# Patient Record
Sex: Male | Born: 1965 | Race: White | Hispanic: No | Marital: Single | Smoking: Current every day smoker
Health system: Southern US, Community
[De-identification: ages and names within clinical notes are randomized; demographics above are authoritative.]

## PROBLEM LIST (undated history)

## (undated) DIAGNOSIS — F419 Anxiety disorder, unspecified: Secondary | ICD-10-CM

## (undated) DIAGNOSIS — J189 Pneumonia, unspecified organism: Secondary | ICD-10-CM

## (undated) DIAGNOSIS — F32A Depression, unspecified: Secondary | ICD-10-CM

## (undated) DIAGNOSIS — F329 Major depressive disorder, single episode, unspecified: Secondary | ICD-10-CM

## (undated) DIAGNOSIS — K219 Gastro-esophageal reflux disease without esophagitis: Secondary | ICD-10-CM

## (undated) HISTORY — DX: Depression, unspecified: F32.A

## (undated) HISTORY — DX: Pneumonia, unspecified organism: J18.9

## (undated) HISTORY — PX: OTHER SURGICAL HISTORY: SHX169

## (undated) HISTORY — DX: Anxiety disorder, unspecified: F41.9

## (undated) HISTORY — DX: Gastro-esophageal reflux disease without esophagitis: K21.9

---
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
## 2019-05-23 ENCOUNTER — Encounter: Payer: Self-pay | Admitting: Gastroenterology

## 2019-05-25 ENCOUNTER — Ambulatory Visit (INDEPENDENT_AMBULATORY_CARE_PROVIDER_SITE_OTHER): Payer: Medicaid Other | Admitting: Gastroenterology

## 2019-05-25 ENCOUNTER — Other Ambulatory Visit: Payer: Self-pay

## 2019-05-25 ENCOUNTER — Encounter: Payer: Self-pay | Admitting: Gastroenterology

## 2019-05-25 VITALS — BP 116/74 | HR 63 | Temp 98.2°F | Ht 73.0 in | Wt 132.5 lb

## 2019-05-25 DIAGNOSIS — R1013 Epigastric pain: Secondary | ICD-10-CM | POA: Diagnosis not present

## 2019-05-25 DIAGNOSIS — K219 Gastro-esophageal reflux disease without esophagitis: Secondary | ICD-10-CM | POA: Diagnosis not present

## 2019-05-25 DIAGNOSIS — Z8371 Family history of colonic polyps: Secondary | ICD-10-CM | POA: Diagnosis not present

## 2019-05-25 DIAGNOSIS — K581 Irritable bowel syndrome with constipation: Secondary | ICD-10-CM | POA: Diagnosis not present

## 2019-05-25 DIAGNOSIS — Z1159 Encounter for screening for other viral diseases: Secondary | ICD-10-CM

## 2019-05-25 MED ORDER — PANTOPRAZOLE SODIUM 40 MG PO TBEC: 40.0000 mg | DELAYED_RELEASE_TABLET | Freq: Every day | ORAL | 11 refills | Status: DC

## 2019-05-25 NOTE — Progress Notes (Signed)
Chief Complaint:   Referring Provider:  Antonietta Jewel, MD      ASSESSMENT AND PLAN;   #1. GERD  #2.  Epigastric pain  #3. IBS with predom constipation  #4. FH of colon polyps and several family members mainly second-degree relatives had colon cancer.   Plan: -protonix 40mg  po qd. -Proceed with EGD/colon. Discussed risks & benefits. (Risks including rare perforation req laparotomy, bleeding after biopsies/polypectomy req blood transfusion, rare chance of missing neoplasms, risks of anesthesia/sedation). Benefits outweigh the risks. Patient agrees to proceed. All the questions were answered. Consent forms given for review. -Stop smoking. -Get bld tests from Dr Roberto Odom. - If still with problems, [place] abdo and further work-up. HPI:    Roberto Odom is a 53 y.o. male  Sent here by Dr. Sheryle Odom With epigastric pain x 1 year, intermittent, worse after eating, associated with heartburn but no nausea/vomiting.  This is despite omeprazole.  No nonsteroidals.  No odynophagia or dysphagia.  No significant weight loss.  In fact he has gained weight.  Also has alternating constipation and occasionally diarrhea with abdominal bloating, lower abdominal discomfort which gets better with defecation.  Has been diagnosed as having IBS in the past.  Never had colonoscopy performed.  Has strong family history of colonic polyps in mother and several second-degree relatives with colon cancer.  No fever chills or night sweats.  Denies use of opioids/drugs/excessive sodas.  Continues to smoke despite medical advice.  Accompanied by his sister, who is a patient of ours.  Had blood work performed by Dr. Sheryle Odom -Per patient's sister, these were normal.  I do not have a copy of the present time   Past Medical History:  Diagnosis Date  . Anxiety   . Depression   . GERD (gastroesophageal reflux disease)   . Pneumonia     Past Surgical History:  Procedure Laterality Date  . thumb surgery Left     age 4 possibly     Family History  Problem Relation Age of Onset  . Colon polyps Mother   . Breast cancer Mother   . Cervical cancer Mother   . Irritable bowel syndrome Mother   . Ovarian cancer Sister   . Colon cancer Maternal Uncle   . Prostate cancer Maternal Uncle   . Colon cancer Maternal Uncle   . Colon cancer Maternal Uncle   . Colon cancer Maternal Uncle   . Irritable bowel syndrome Sister   . Esophageal cancer Neg Hx     Social History   Tobacco Use  . Smoking status: Current Every Day Smoker  . Smokeless tobacco: Never Used  Substance Use Topics  . Alcohol use: Not Currently  . Drug use: Not Currently    Current Outpatient Medications  Medication Sig Dispense Refill  . Acetaminophen (TYLENOL PO) Take 1 tablet by mouth as needed.    . cetirizine (ZYRTEC) 10 MG tablet Take 20 mg by mouth daily.    . fluticasone (FLONASE) 50 MCG/ACT nasal spray 2 sprays 2 (two) times daily.    Marland Kitchen omeprazole (PRILOSEC) 20 MG capsule Take 20 mg by mouth 2 (two) times daily.     No current facility-administered medications for this visit.     Allergies  Allergen Reactions  . Ibuprofen     Rapid heartbeat     Review of Systems:  Constitutional: Denies fever, chills, diaphoresis, appetite change and fatigue.  HEENT: Denies photophobia, eye pain, redness, hearing loss, ear pain, congestion, sore throat, rhinorrhea, sneezing, mouth  sores, neck pain, neck stiffness and tinnitus.   Respiratory: Denies SOB, DOE, cough, chest tightness,  and wheezing.   Cardiovascular: Denies chest pain, palpitations and leg swelling.  Genitourinary: Denies dysuria, urgency, frequency, hematuria, flank pain and difficulty urinating.  Musculoskeletal: Denies myalgias, back pain, joint swelling, arthralgias and gait problem.  Skin: No rash.  Neurological: Denies dizziness, seizures, syncope, weakness, light-headedness, numbness and headaches.  Hematological: Denies adenopathy. Easy bruising, personal  or family bleeding history  Psychiatric/Behavioral: has anxiety or depression     Physical Exam:    BP 116/74   Pulse 63   Temp 98.2 F (36.8 C)   Ht 6\' 1"  (1.854 m)   Wt 132 lb 8 oz (60.1 kg)   BMI 17.48 kg/m  Filed Weights   05/25/19 1110  Weight: 132 lb 8 oz (60.1 kg)   Constitutional:  Well-developed, in no acute distress. Psychiatric: Normal mood and affect. Behavior is normal. HEENT: Pupils normal.  Conjunctivae are normal. No scleral icterus. Neck supple.  Cardiovascular: Normal rate, regular rhythm. No edema Pulmonary/chest: Effort normal and breath sounds normal. No wheezing, rales or rhonchi. Abdominal: Soft, nondistended.  Mild epigastric tenderness. Bowel sounds active throughout. There are no masses palpable. No hepatomegaly. Rectal:  defered Neurological: Alert and oriented to person place and time. Skin: Skin is warm and dry. No rashes noted.   Roberto Austria, MD 05/25/2019, 11:22 AM  Cc: Roberto Jewel, MD

## 2019-05-25 NOTE — Patient Instructions (Signed)
If you are age 55 or older, your body mass index should be between 23-30. Your Body mass index is 17.48 kg/m. If this is out of the aforementioned range listed, please consider follow up with your Primary Care Provider.  If you are age 51 or younger, your body mass index should be between 19-25. Your Body mass index is 17.48 kg/m. If this is out of the aformentioned range listed, please consider follow up with your Primary Care Provider.   We have sent the following medications to your pharmacy for you to pick up at your convenience: Protonix   You have been scheduled for an endoscopy and colonoscopy. Please follow the written instructions given to you at your visit today. Please pick up your prep supplies at the pharmacy within the next 1-3 days. If you use inhalers (even only as needed), please bring them with you on the day of your procedure. Your physician has requested that you go to www.startemmi.com and enter the access code given to you at your visit today. This web site gives a general overview about your procedure. However, you should still follow specific instructions given to you by our office regarding your preparation for the procedure.  Due to recent COVID-19 restrictions implemented by [employer] and state authorities and in an effort to keep both patients and staff as safe as possible, [facility] requires COVID-19 testing prior to any scheduled endoscopic procedure. The testing center is located at [address] [place], [place] [address].  Testing will be performed Monday through Friday 8 am- 4 pm.  You will require your COVID screen 2 days prior to your endoscopic procedure.  You are not required to quarantine after your screening.  You will only receive a phone call with the results if it is POSITIVE.  If you do not receive a call the day before your procedure you should begin your prep, if ordered, and you should report to the endo center  for your procedure at your designated appointment arrival time ( one hour prior to the procedure time). There is no cost to you for the screening on the day of the swab.  [facility] will file with your insurance company for the testing.    You may receive an automated phone call prior to your procedure or have a message in your MyChart that you have an appointment for a BP/15 at the [facility], please disregard this message.  Your testing will be at the [address] , [address] location.   Thank you,  Dr. Jackquline Denmark

## 2019-05-26 ENCOUNTER — Telehealth: Payer: Self-pay | Admitting: Gastroenterology

## 2019-05-26 NOTE — Telephone Encounter (Signed)
I have resent patients directions and rescheduled the patient for their COVID test. I have mailed all instructions to patient.

## 2019-05-26 NOTE — Telephone Encounter (Signed)
Pt's sister rescheduled EGD/col to 06/20/19 at 2 PM and would like to know when to have COVID-19 test.  She also requested new instructions letter.

## 2019-06-03 ENCOUNTER — Inpatient Hospital Stay (HOSPITAL_COMMUNITY)
Admission: RE | Admit: 2019-06-03 | Discharge: 2019-06-03 | Disposition: A | Discharge status: Home / Self Care | Payer: Medicaid Other | Source: Ambulatory Visit

## 2019-06-03 NOTE — Progress Notes (Signed)
LVM for pt to reschedule COVID test, as he is too early. Pt needs to be tested on 11/2 for a 11/5 procedure.

## 2019-06-07 ENCOUNTER — Encounter: Payer: Medicaid Other | Admitting: Gastroenterology

## 2019-06-13 ENCOUNTER — Other Ambulatory Visit (HOSPITAL_COMMUNITY)
Admission: RE | Admit: 2019-06-13 | Discharge: 2019-06-13 | Disposition: A | Discharge status: Home / Self Care | Payer: Medicaid Other | Source: Ambulatory Visit | Attending: Gastroenterology | Admitting: Gastroenterology

## 2019-06-13 NOTE — Progress Notes (Addendum)
Called patient to reschedule his covid test because it is to early for him to be tested prior to the procedure on 11/9, phone will ring, someone answers then hangs up. No way to leave a voicemail.  Attempted to call 3 times  3:21 PM Left message for patient who will need to go to [facility] for his Covid testing. Left address on voice mail

## 2019-06-16 ENCOUNTER — Other Ambulatory Visit: Payer: Self-pay | Admitting: Gastroenterology

## 2019-06-17 LAB — SARS CORONAVIRUS 2 (TAT 6-24 HRS): SARS Coronavirus 2: NEGATIVE

## 2019-06-20 ENCOUNTER — Other Ambulatory Visit: Payer: Self-pay

## 2019-06-20 ENCOUNTER — Encounter: Payer: Self-pay | Admitting: Gastroenterology

## 2019-06-20 ENCOUNTER — Other Ambulatory Visit: Payer: Self-pay | Admitting: Gastroenterology

## 2019-06-20 ENCOUNTER — Ambulatory Visit (AMBULATORY_SURGERY_CENTER): Payer: Medicaid Other | Admitting: Gastroenterology

## 2019-06-20 VITALS — BP 121/74 | HR 65 | Temp 97.8°F | Resp 12 | Ht 73.0 in | Wt 132.0 lb

## 2019-06-20 DIAGNOSIS — K219 Gastro-esophageal reflux disease without esophagitis: Secondary | ICD-10-CM | POA: Diagnosis not present

## 2019-06-20 DIAGNOSIS — K2950 Unspecified chronic gastritis without bleeding: Secondary | ICD-10-CM | POA: Diagnosis not present

## 2019-06-20 DIAGNOSIS — K3189 Other diseases of stomach and duodenum: Secondary | ICD-10-CM

## 2019-06-20 DIAGNOSIS — Z1211 Encounter for screening for malignant neoplasm of colon: Secondary | ICD-10-CM | POA: Diagnosis not present

## 2019-06-20 DIAGNOSIS — Z8371 Family history of colonic polyps: Secondary | ICD-10-CM

## 2019-06-20 DIAGNOSIS — D128 Benign neoplasm of rectum: Secondary | ICD-10-CM

## 2019-06-20 DIAGNOSIS — D123 Benign neoplasm of transverse colon: Secondary | ICD-10-CM | POA: Diagnosis not present

## 2019-06-20 DIAGNOSIS — D125 Benign neoplasm of sigmoid colon: Secondary | ICD-10-CM

## 2019-06-20 MED ORDER — SODIUM CHLORIDE 0.9 % IV SOLN: 500.0000 mL | Freq: Once | INTRAVENOUS | Status: DC

## 2019-06-20 NOTE — Op Note (Signed)
[facility] Patient Name: Roberto Odom Procedure Date: 06/20/2019 2:00 PM MRN: [id number] Endoscopist: Jackquline Denmark , MD Age: 53 Referring MD:  Date of Birth: [birth year] Gender: Male Account #: [id number] Procedure:                Colonoscopy Indications:              Colon cancer screening in patient at increased                            risk: Family history of 1st-degree relative with                            colon polyps before age 67 years Medicines:                Monitored Anesthesia Care Procedure:                Pre-Anesthesia Assessment:                           - Prior to the procedure, a History and Physical                            was performed, and patient medications and                            allergies were reviewed. The patient's tolerance of                            previous anesthesia was also reviewed. The risks                            and benefits of the procedure and the sedation                            options and risks were discussed with the patient.                            All questions were answered, and informed consent                            was obtained. Prior Anticoagulants: The patient has                            taken no previous anticoagulant or antiplatelet                            agents. ASA Grade Assessment: II - A patient with                            mild systemic disease. After reviewing the risks                            and benefits, the patient was deemed in  satisfactory condition to undergo the procedure.                           After obtaining informed consent, the colonoscope                            was passed under direct vision. Throughout the                            procedure, the patient's blood pressure, pulse, and                            oxygen saturations were monitored continuously. The                            Colonoscope was introduced  through the anus and                            advanced to the the cecum, identified by                            appendiceal orifice and ileocecal valve. The                            colonoscopy was performed without difficulty. The                            patient tolerated the procedure well. The quality                            of the bowel preparation was adequate to identify                            polyps. The ileocecal valve, appendiceal orifice,                            and rectum were photographed. Scope In: 2:14:16 PM Scope Out: 2:29:35 PM Scope Withdrawal Time: 0 hours 11 minutes 34 seconds  Total Procedure Duration: 0 hours 15 minutes 19 seconds  Findings:                 A 6 mm polyp was found in the proximal transverse                            colon. The polyp was sessile. The polyp was removed                            with a cold snare. Resection and retrieval were                            complete.                           A 15 mm polyp was found in the recto-sigmoid colon,  18 cm from anal verge. The polyp was                            semi-pedunculated. The polyp was removed with a hot                            snare. Resection and retrieval were complete.                            Estimated blood loss: none.                           A 12 mm polyp was found in the rectum, 12 cm from                            anal verge. The polyp was sessile. The polyp was                            removed with a hot snare in one piece. Resection                            and retrieval were complete. Estimated blood loss:                            none.                           A few small-mouthed diverticula were found in the                            sigmoid colon.                           The exam was otherwise without abnormality on                            direct and retroflexion views. Complications:            No  immediate complications. Estimated Blood Loss:     Estimated blood loss: none. Impression:               -Significant colonic polyps s/p polypectomy.                           -Mild sigmoid diverticulosis.                           -Otherwise normal colonoscopy. Recommendation:           - Patient has a contact number available for                            emergencies. The signs and symptoms of potential                            delayed complications were discussed with the  patient. Return to normal activities tomorrow.                            Written discharge instructions were provided to the                            patient.                           - Resume previous diet.                           - Continue present medications.                           - Await pathology results.                           - No aspirin, ibuprofen, naproxen, or other                            non-steroidal anti-inflammatory drugs for 5 days                            after polyp removal.                           - Repeat colonoscopy in 6 months for surveillance                            based on pathology results to remove any                            residual/recurrent/small polyps after 2-day prep..                           - Return to GI clinic PRN. Jackquline Denmark, MD 06/20/2019 2:43:28 PM This report has been signed electronically.

## 2019-06-20 NOTE — Progress Notes (Signed)
Called to room to assist during endoscopic procedure.  Patient ID and intended procedure confirmed with present staff. Received instructions for my participation in the procedure from the performing physician.  

## 2019-06-20 NOTE — Progress Notes (Signed)
To PACU, VSS. Report to [email or web address] 

## 2019-06-20 NOTE — Op Note (Signed)
[facility] Patient Name: Roberto Odom Procedure Date: 06/20/2019 2:00 PM MRN: [id number] Endoscopist: Jackquline Denmark , MD Age: 53 Referring MD:  Date of Birth: 05-20-1966 Gender: Male Account #: [id number] Procedure:                Upper GI endoscopy Indications:              GERD Medicines:                Monitored Anesthesia Care Procedure:                Pre-Anesthesia Assessment:                           - Prior to the procedure, a History and Physical                            was performed, and patient medications and                            allergies were reviewed. The patient's tolerance of                            previous anesthesia was also reviewed. The risks                            and benefits of the procedure and the sedation                            options and risks were discussed with the patient.                            All questions were answered, and informed consent                            was obtained. Prior Anticoagulants: The patient has                            taken no previous anticoagulant or antiplatelet                            agents. ASA Grade Assessment: II - A patient with                            mild systemic disease. After reviewing the risks                            and benefits, the patient was deemed in                            satisfactory condition to undergo the procedure.                           After obtaining informed consent, the endoscope was  passed under direct vision. Throughout the                            procedure, the patient's blood pressure, pulse, and                            oxygen saturations were monitored continuously. The                            Endoscope was introduced through the mouth, and                            advanced to the second part of duodenum. The upper                            GI endoscopy was accomplished without difficulty.                             The patient tolerated the procedure well. Scope In: Scope Out: Findings:                 The examined esophagus was normal.                           The Z-line was regular and was found 40 cm from the                            incisors. Examined by NBI.                           Localized mild inflammation characterized by                            erythema was found in the gastric antrum. Biopsies                            were taken with a cold forceps for histology.                           The examined duodenum was normal. Complications:            No immediate complications. Estimated Blood Loss:     Estimated blood loss: none. Impression:               -Mild gastritis.                           -Otherwise normal EGD. Recommendation:           - Patient has a contact number available for                            emergencies. The signs and symptoms of potential                            delayed complications were discussed with the  patient. Return to normal activities tomorrow.                            Written discharge instructions were provided to the                            patient.                           - Resume previous diet.                           - Continue present medications.                           - Await pathology results. Jackquline Denmark, MD 06/20/2019 2:36:19 PM This report has been signed electronically.

## 2019-06-20 NOTE — Progress Notes (Signed)
Pt's states no medical or surgical changes since previsit or office visit.  Temp-jb  V/s-[place]

## 2019-06-20 NOTE — Patient Instructions (Signed)
HANDOUTS GIVEN : POLYPS, GASTRITIS  NO ASPIRIN, ASPIRIN CONTAINING PRODUCTS (BC OR GOODY POWDERS) OR NSAIDS (IBUPROFEN, ADVIL, ALEVE, AND MOTRIN) FOR 5 DAYS, 06/25/2019; TYLENOL IS OK TO TAKE  EXPECT A REMINDER LETTER TO SCHEDULE REPEAT COLONOSCOPY IN 6 MONTHS TO CHECK FOR RESIDUAL POLYPS  YOU HAD AN ENDOSCOPIC PROCEDURE TODAY AT [facility]:   Refer to the procedure report that was given to you for any specific questions about what was found during the examination.  If the procedure report does not answer your questions, please call your gastroenterologist to clarify.  If you requested that your care partner not be given the details of your procedure findings, then the procedure report has been included in a sealed envelope for you to review at your convenience later.  YOU SHOULD EXPECT: Some feelings of bloating in the abdomen. Passage of more gas than usual.  Walking can help get rid of the air that was put into your GI tract during the procedure and reduce the bloating. If you had a lower endoscopy (such as a colonoscopy or flexible sigmoidoscopy) you may notice spotting of blood in your stool or on the toilet paper. If you underwent a bowel prep for your procedure, you may not have a normal bowel movement for a few days.  Please Note:  You might notice some irritation and congestion in your nose or some drainage.  This is from the oxygen used during your procedure.  There is no need for concern and it should clear up in a day or so.  SYMPTOMS TO REPORT IMMEDIATELY:   Following lower endoscopy (colonoscopy or flexible sigmoidoscopy):  Excessive amounts of blood in the stool  Significant tenderness or worsening of abdominal pains  Swelling of the abdomen that is new, acute  Fever of 100F or higher   Following upper endoscopy (EGD)  Vomiting of blood or coffee ground material  New chest pain or pain under the shoulder blades  Painful or persistently difficult  swallowing  New shortness of breath  Fever of 100F or higher  Black, tarry-looking stools  For urgent or emergent issues, a gastroenterologist can be reached at any hour by calling [phone].   DIET:  We do recommend a small meal at first, but then you may proceed to your regular diet.  Drink plenty of fluids but you should avoid alcoholic beverages for 24 hours.  ACTIVITY:  You should plan to take it easy for the rest of today and you should NOT DRIVE or use heavy machinery until tomorrow (because of the sedation medicines used during the test).    FOLLOW UP: Our staff will call the number listed on your records 48-72 hours following your procedure to check on you and address any questions or concerns that you may have regarding the information given to you following your procedure. If we do not reach you, we will leave a message.  We will attempt to reach you two times.  During this call, we will ask if you have developed any symptoms of COVID 19. If you develop any symptoms (ie: fever, flu-like symptoms, shortness of breath, cough etc.) before then, please call [phone].  If you test positive for Covid 19 in the 2 weeks post procedure, please call and report this information to [place].    If any biopsies were taken you will be contacted by phone or by letter within the next 1-3 weeks.  Please call us at [phone] if you have not heard  about the biopsies in 3 weeks.    SIGNATURES/CONFIDENTIALITY: You and/or your care partner have signed paperwork which will be entered into your electronic medical record.  These signatures attest to the fact that that the information above on your After Visit Summary has been reviewed and is understood.  Full responsibility of the confidentiality of this discharge information lies with you and/or your care-partner.

## 2019-06-22 ENCOUNTER — Telehealth: Payer: Self-pay

## 2019-06-22 NOTE — Telephone Encounter (Signed)
  Follow up Call-  Call back number 06/20/2019  Post procedure Call Back phone  # [phone]  Permission to leave phone message Yes     Patient questions:  Do you have a fever, pain , or abdominal swelling? No. Pain Score  0 *  Have you tolerated food without any problems? Yes.    Have you been able to return to your normal activities? Yes.    Do you have any questions about your discharge instructions: Diet   No. Medications  No. Follow up visit  No.  Do you have questions or concerns about your Care? No.  Actions: * If pain score is 4 or above: No action needed, pain <4.  1. Have you developed a fever since your procedure? no  2.   Have you had an respiratory symptoms (SOB or cough) since your procedure? no  3.   Have you tested positive for COVID 19 since your procedure?  no  4.   Have you had any family members/close contacts diagnosed with the COVID 19 since your procedure?  no   If yes to any of these questions please route to Joylene John, RN and Alphonsa Gin, Therapist, sports.

## 2019-06-26 ENCOUNTER — Encounter: Payer: Self-pay | Admitting: Gastroenterology

## 2019-12-12 ENCOUNTER — Telehealth: Payer: Self-pay | Admitting: Gastroenterology

## 2019-12-12 NOTE — Telephone Encounter (Signed)
Pt's sister Terrance Mass called stating that Prilosec no longer works for pt. He has been experiencing pain and has not been able to eat. She would like some advise.

## 2019-12-12 NOTE — Telephone Encounter (Signed)
Roberto Odom's contact # is [phone].

## 2019-12-13 NOTE — Telephone Encounter (Signed)
Please advise 

## 2019-12-14 ENCOUNTER — Telehealth: Payer: Self-pay | Admitting: Gastroenterology

## 2019-12-14 ENCOUNTER — Other Ambulatory Visit: Payer: Self-pay | Admitting: Gastroenterology

## 2019-12-14 MED ORDER — PANTOPRAZOLE SODIUM 40 MG PO TBEC: 40.0000 mg | DELAYED_RELEASE_TABLET | Freq: Every day | ORAL | 6 refills | Status: DC

## 2019-12-14 NOTE — Telephone Encounter (Signed)
Protonix 40 mg daily #30  6RF sent to pharmacy

## 2019-12-14 NOTE — Telephone Encounter (Signed)
LMOM for patient to call back.

## 2019-12-14 NOTE — Telephone Encounter (Signed)
Please call in Protonix 40 mg p.o. once a day, 30, 6 refills RG

## 2019-12-19 NOTE — Telephone Encounter (Signed)
I have called patient and left message for a return call.

## 2019-12-20 NOTE — Telephone Encounter (Signed)
Have left message for patient to return my call.

## 2019-12-21 MED ORDER — FAMOTIDINE 40 MG PO TABS: 40.0000 mg | ORAL_TABLET | Freq: Every day | ORAL | 3 refills | Status: DC

## 2019-12-21 NOTE — Telephone Encounter (Signed)
Patient is complaining of bad acid reflux and is currently taking Protonix twice daily. Patient would like to know if there is anything else he can take?

## 2019-12-21 NOTE — Telephone Encounter (Signed)
Sent prescription in to patients pharmacy. Patient notified.

## 2019-12-21 NOTE — Telephone Encounter (Signed)
Lets add Pepcid 40 mg p.o. nightly Let us know how he is in 2 weeks Stop all nonsteroidals  RG

## 2020-09-07 ENCOUNTER — Other Ambulatory Visit: Payer: Self-pay | Admitting: Gastroenterology

## 2020-09-26 ENCOUNTER — Other Ambulatory Visit (INDEPENDENT_AMBULATORY_CARE_PROVIDER_SITE_OTHER): Payer: Medicaid Other

## 2020-09-26 ENCOUNTER — Ambulatory Visit (HOSPITAL_BASED_OUTPATIENT_CLINIC_OR_DEPARTMENT_OTHER)
Admission: RE | Admit: 2020-09-26 | Discharge: 2020-09-26 | Disposition: A | Discharge status: Home / Self Care | Payer: Medicaid Other | Source: Ambulatory Visit | Attending: Gastroenterology | Admitting: Gastroenterology

## 2020-09-26 ENCOUNTER — Encounter: Payer: Self-pay | Admitting: Gastroenterology

## 2020-09-26 ENCOUNTER — Other Ambulatory Visit: Payer: Self-pay

## 2020-09-26 ENCOUNTER — Ambulatory Visit (INDEPENDENT_AMBULATORY_CARE_PROVIDER_SITE_OTHER): Payer: Medicaid Other | Admitting: Gastroenterology

## 2020-09-26 VITALS — BP 132/92 | HR 78 | Ht 70.0 in | Wt 119.5 lb

## 2020-09-26 DIAGNOSIS — Z86010 Personal history of colonic polyps: Secondary | ICD-10-CM

## 2020-09-26 DIAGNOSIS — R112 Nausea with vomiting, unspecified: Secondary | ICD-10-CM

## 2020-09-26 DIAGNOSIS — K219 Gastro-esophageal reflux disease without esophagitis: Secondary | ICD-10-CM | POA: Insufficient documentation

## 2020-09-26 DIAGNOSIS — R634 Abnormal weight loss: Secondary | ICD-10-CM

## 2020-09-26 DIAGNOSIS — K582 Mixed irritable bowel syndrome: Secondary | ICD-10-CM | POA: Insufficient documentation

## 2020-09-26 DIAGNOSIS — R1033 Periumbilical pain: Secondary | ICD-10-CM | POA: Diagnosis present

## 2020-09-26 LAB — CBC WITH DIFFERENTIAL/PLATELET
Basophils Absolute: 0.1 10*3/uL (ref 0.0–0.1)
Basophils Relative: 0.8 % (ref 0.0–3.0)
Eosinophils Absolute: 0.1 10*3/uL (ref 0.0–0.7)
Eosinophils Relative: 1.5 % (ref 0.0–5.0)
HCT: 45.8 % (ref 39.0–52.0)
Hemoglobin: 15.2 g/dL (ref 13.0–17.0)
Lymphocytes Relative: 30.5 % (ref 12.0–46.0)
Lymphs Abs: 2.6 10*3/uL (ref 0.7–4.0)
MCHC: 33.3 g/dL (ref 30.0–36.0)
MCV: 89.2 fl (ref 78.0–100.0)
Monocytes Absolute: 0.6 10*3/uL (ref 0.1–1.0)
Monocytes Relative: 6.5 % (ref 3.0–12.0)
Neutro Abs: 5.2 10*3/uL (ref 1.4–7.7)
Neutrophils Relative %: 60.7 % (ref 43.0–77.0)
Platelets: 285 10*3/uL (ref 150.0–400.0)
RBC: 5.14 Mil/uL (ref 4.22–5.81)
RDW: 13.1 % (ref 11.5–15.5)
WBC: 8.5 10*3/uL (ref 4.0–10.5)

## 2020-09-26 LAB — COMPREHENSIVE METABOLIC PANEL
ALT: 9 U/L (ref 0–53)
AST: 15 U/L (ref 0–37)
Albumin: 4.7 g/dL (ref 3.5–5.2)
Alkaline Phosphatase: 52 U/L (ref 39–117)
BUN: 8 mg/dL (ref 6–23)
CO2: 31 mEq/L (ref 19–32)
Calcium: 10 mg/dL (ref 8.4–10.5)
Chloride: 100 mEq/L (ref 96–112)
Creatinine, Ser: 0.83 mg/dL (ref 0.40–1.50)
GFR: 97.62 mL/min (ref >60.00)
Glucose, Bld: 101 mg/dL — ABNORMAL HIGH (ref 70–99)
Potassium: 4.9 mEq/L (ref 3.5–5.1)
Sodium: 138 mEq/L (ref 135–145)
Total Bilirubin: 0.5 mg/dL (ref 0.2–1.2)
Total Protein: 7.5 g/dL (ref 6.0–8.3)

## 2020-09-26 LAB — HIGH SENSITIVITY CRP: CRP, High Sensitivity: 0.52 mg/L (ref 0.000–5.000)

## 2020-09-26 LAB — TSH: TSH: 0.96 u[IU]/mL (ref 0.35–4.50)

## 2020-09-26 MED ORDER — PANTOPRAZOLE SODIUM 40 MG PO TBEC: 40.0000 mg | DELAYED_RELEASE_TABLET | Freq: Two times a day (BID) | ORAL | 11 refills | Status: DC

## 2020-09-26 MED ORDER — FAMOTIDINE 40 MG PO TABS: 40.0000 mg | ORAL_TABLET | Freq: Every day | ORAL | 11 refills | Status: DC

## 2020-09-26 NOTE — Patient Instructions (Signed)
If you are age 57 or older, your body mass index should be between 23-30. Your Body mass index is 17.15 kg/m. If this is out of the aforementioned range listed, please consider follow up with your Primary Care Provider.  If you are age 56 or younger, your body mass index should be between 19-25. Your Body mass index is 17.15 kg/m. If this is out of the aformentioned range listed, please consider follow up with your Primary Care Provider.   You have been scheduled for a CT [facility] the abdomen and [facility] [place] [address] [facility]   You are scheduled on     At      . You should arrive 15 minutes prior to your appointment time for registration. Please follow the written instructions below on the day of your exam:  WARNING: IF YOU ARE ALLERGIC TO IODINE/X-RAY DYE, PLEASE NOTIFY RADIOLOGY IMMEDIATELY AT [phone]! YOU WILL BE GIVEN A 13 HOUR PREMEDICATION PREP.  1) Do not eat or drink anything after      (4 hours prior to your test) 2) You have been given 2 bottles of oral contrast to drink. The solution may taste better if refrigerated, but do NOT add ice or any other liquid to this solution. Shake well before drinking.    Drink 1 bottle of contrast @      (2 hours prior to your exam)  Drink 1 bottle of contrast @      (1 hour prior to your exam)  You may take any medications as prescribed with a small amount of water, if necessary. If you take any of the following medications: METFORMIN, GLUCOPHAGE, GLUCOVANCE, AVANDAMET, RIOMET, FORTAMET, Emlyn MET, JANUMET, GLUMETZA or METAGLIP, you MAY be asked to HOLD this medication 48 hours AFTER the exam.  The purpose of you drinking the oral contrast is to aid in the visualization of your intestinal tract. The contrast solution may cause some diarrhea. Depending on your individual set of symptoms, you may also receive an intravenous injection of x-ray contrast/dye. Plan on being at  [facility] for 30 minutes or longer, depending on the type of exam you are having performed.  This test typically takes 30-45 minutes to complete.  If you have any questions regarding your exam or if you need to reschedule, you may call the CT department at [phone] between the hours of 8:00 am and 5:00 pm, Monday-Friday.  ________________________________________________________________________  Please go to the lab on the 2nd floor [address] before you leave the office today.   Stop by radiology on the 1st floor ([address]) to have your chest x-ray done  We have sent the following medications to your pharmacy for you to pick up at your convenience: Protonix 65m twice daily Pepcid 40 nightly  Stop smoking  Please call in April to schedule an May appointment for a Colonoscopy with a previsit nurse with a 2 day prep please  Thank you,  Dr. RJackquline Denmark

## 2020-09-26 NOTE — Progress Notes (Signed)
Chief Complaint: FU  Referring Provider:  Antonietta Jewel, MD      ASSESSMENT AND PLAN;   #1. GERD. Neg EGD 06/2019  #2.  Periumblical pain with N/V with wt loss (12lb over 1 yr). R/O PSBO  #3. IBS with alt diarrhea and costipation  #4. H/O advanced colon polyps.   Plan: -protonix 40mg  po Bid #60, 11 reflils -Pepcid 40mg  po qhs -CT AP with PO and IV contrast. -Chest X Ray PA and lat. -Stop smoking -CBC, CMP, TSH, CRP, celiac serology, TSH -Repeat colon.  He wants to wait until April/May 2022 (with 2 day prep) -Boost 1/day.  -Discussed compliance with patient and family.  Also advised him to get a weighing scale and weigh every week.  He is to report if there is any further weight loss. -FU after the above.   HPI:    Roberto Odom is a 55 y.o. male   For follow-up visit.  Still with reflux but has to take Protonix twice a day and Pepcid at night.  No odynophagia or dysphagia.  Continues to smoke despite medical advice.  Has been having periumbilical sharp abdominal pain, abdominal bloating, intermittent nausea/vomiting with associated weight loss as detailed below.  No jaundice dark urine or pale stools.  No fever chills or night sweats.  Has alternating diarrhea and constipation.  Also has lower abdominal discomfort.  Had colonoscopy as below with multiple polyps.  He was advised to get repeat colonoscopy in 6 months.  He did not come despite letters.  He also did not come for follow-up visit until today.  Wt Readings from Last 3 Encounters:  09/26/20 119 lb 8 oz (54.2 kg)  06/20/19 132 lb (59.9 kg)  05/25/19 132 lb 8 oz (60.1 kg)    Accompanied by his sister, who is a patient of ours.  Had blood work performed by Dr. Sheryle Hail -Per patient's sister, these were normal.  I do not have a copy of the present time   Past GI procedures:  EGD 06/20/2019 -Mild gastritis. -Otherwise normal EGD. Neg HP  Colonoscopy 06/20/2019: -Significant colonic polyps s/p  polypectomy. -Mild sigmoid diverticulosis. -Otherwise normal colonoscopy. -Limited due to quality of preparation. -Repeat: In 6 months.  Past Medical History:  Diagnosis Date  . Anxiety   . Depression   . GERD (gastroesophageal reflux disease)   . Pneumonia     Past Surgical History:  Procedure Laterality Date  . thumb surgery Left    age 63 possibly     Family History  Problem Relation Age of Onset  . Colon polyps Mother   . Breast cancer Mother   . Cervical cancer Mother   . Irritable bowel syndrome Mother   . Ovarian cancer Sister   . Colon cancer Maternal Uncle   . Prostate cancer Maternal Uncle   . Colon cancer Maternal Uncle   . Colon cancer Maternal Uncle   . Colon cancer Maternal Uncle   . Irritable bowel syndrome Sister   . Colon cancer Maternal Uncle   . Colon cancer Maternal Uncle   . Colon cancer Maternal Uncle   . Esophageal cancer Neg Hx     Social History   Tobacco Use  . Smoking status: Current Every Day Smoker  . Smokeless tobacco: Current User    Types: Snuff  Vaping Use  . Vaping Use: Never used  Substance Use Topics  . Alcohol use: Not Currently  . Drug use: Not Currently    Current Outpatient Medications  Medication Sig Dispense Refill  . Acetaminophen (TYLENOL PO) Take 1 tablet by mouth as needed.    . cetirizine (ZYRTEC) 10 MG tablet Take 20 mg by mouth daily.    . famotidine (PEPCID) 40 MG tablet Take 1 tablet (40 mg total) by mouth at bedtime. 30 tablet 3  . fluticasone (FLONASE) 50 MCG/ACT nasal spray 2 sprays 2 (two) times daily.    Marland Kitchen omeprazole (PRILOSEC) 20 MG capsule Take 20 mg by mouth 2 (two) times daily.    . pantoprazole (PROTONIX) 40 MG tablet Take 1 tablet (40 mg total) by mouth daily. 30 tablet 6  . Multiple Vitamins-Minerals (CENTRUM SILVER PO) Take 1 tablet by mouth daily. (Patient not taking: Reported on 09/26/2020)     No current facility-administered medications for this visit.    Allergies  Allergen Reactions   . Ibuprofen     Rapid heartbeat     Review of Systems:  Constitutional: Denies fever, chills, diaphoresis, appetite change and fatigue.  HEENT: Denies photophobia, eye pain, redness, hearing loss, ear pain, congestion, sore throat, rhinorrhea, sneezing, mouth sores, neck pain, neck stiffness and tinnitus.   Respiratory: Denies SOB, DOE, cough, chest tightness,  and wheezing.   Cardiovascular: Denies chest pain, palpitations and leg swelling.  Genitourinary: Denies dysuria, urgency, frequency, hematuria, flank pain and difficulty urinating.  Musculoskeletal: Denies myalgias, back pain, joint swelling, arthralgias and gait problem.  Skin: No rash.  Neurological: Denies dizziness, seizures, syncope, weakness, light-headedness, numbness and headaches.  Hematological: Denies adenopathy. Easy bruising, personal or family bleeding history  Psychiatric/Behavioral: has anxiety or depression     Physical Exam:    BP (!) 132/92 (BP Location: Left Arm, Patient Position: Sitting, Cuff Size: Normal)   Pulse 78   Ht 5\' 10"  (1.778 m)   Wt 119 lb 8 oz (54.2 kg)   BMI 17.15 kg/m  Filed Weights   09/26/20 0831  Weight: 119 lb 8 oz (54.2 kg)   Constitutional:  Well-developed, in no acute distress. Psychiatric: Normal mood and affect. Behavior is normal. HEENT: Pupils normal.  Conjunctivae are normal. No scleral icterus. Neck supple.  Cardiovascular: Normal rate, regular rhythm. No edema Pulmonary/chest: Effort normal and breath sounds decreased bilaterally.  No wheezing, rales or rhonchi. Abdominal: Soft, nondistended.  Mild epigastric tenderness. Bowel sounds active throughout. There are no masses palpable. No hepatomegaly.  Small supraumbilical hernia.  Nontender. Rectal:  defered Neurological: Alert and oriented to person place and time. Skin: Skin is warm and dry. No rashes noted.   Roberto Austria, MD 09/26/2020, 9:07 AM  Cc: Antonietta Jewel, MD

## 2020-09-29 LAB — CELIAC PANEL 10
Antigliadin Abs, IgA: 6 units (ref 0–19)
Endomysial IgA: NEGATIVE
Gliadin IgG: 2 units (ref 0–19)
IgA/Immunoglobulin A, Serum: 202 mg/dL (ref 90–386)
Tissue Transglut Ab: 5 U/mL (ref 0–5)
Transglutaminase IgA: <2 U/mL (ref 0–3)

## 2020-09-30 NOTE — Progress Notes (Signed)
Please inform the patient. Abn CxR, wt loss Can we add CT chest to CT Abdo/pelvis already scheduled Appt with Dr Alcide Clever (pulm). Send report to family physician

## 2020-10-01 ENCOUNTER — Other Ambulatory Visit: Payer: Self-pay

## 2020-10-01 DIAGNOSIS — R634 Abnormal weight loss: Secondary | ICD-10-CM

## 2020-10-01 DIAGNOSIS — R918 Other nonspecific abnormal finding of lung field: Secondary | ICD-10-CM

## 2020-10-03 ENCOUNTER — Ambulatory Visit (HOSPITAL_BASED_OUTPATIENT_CLINIC_OR_DEPARTMENT_OTHER)
Admission: RE | Admit: 2020-10-03 | Discharge: 2020-10-03 | Disposition: A | Discharge status: Home / Self Care | Payer: Medicaid Other | Source: Ambulatory Visit | Attending: Gastroenterology | Admitting: Gastroenterology

## 2020-10-03 ENCOUNTER — Other Ambulatory Visit: Payer: Self-pay

## 2020-10-03 ENCOUNTER — Encounter (HOSPITAL_BASED_OUTPATIENT_CLINIC_OR_DEPARTMENT_OTHER): Payer: Self-pay

## 2020-10-03 DIAGNOSIS — R918 Other nonspecific abnormal finding of lung field: Secondary | ICD-10-CM | POA: Insufficient documentation

## 2020-10-03 DIAGNOSIS — Z86010 Personal history of colonic polyps: Secondary | ICD-10-CM | POA: Diagnosis present

## 2020-10-03 DIAGNOSIS — R634 Abnormal weight loss: Secondary | ICD-10-CM | POA: Insufficient documentation

## 2020-10-03 DIAGNOSIS — K219 Gastro-esophageal reflux disease without esophagitis: Secondary | ICD-10-CM | POA: Diagnosis present

## 2020-10-03 DIAGNOSIS — R112 Nausea with vomiting, unspecified: Secondary | ICD-10-CM | POA: Diagnosis present

## 2020-10-03 DIAGNOSIS — K582 Mixed irritable bowel syndrome: Secondary | ICD-10-CM | POA: Diagnosis present

## 2020-10-03 DIAGNOSIS — R1033 Periumbilical pain: Secondary | ICD-10-CM | POA: Diagnosis present

## 2020-10-03 MED ORDER — IOHEXOL 300 MG/ML  SOLN
100.0000 mL | Freq: Once | INTRAMUSCULAR | Status: AC | PRN
  Administered 2020-10-03: 100 mL via INTRAVENOUS

## 2020-10-04 NOTE — Progress Notes (Signed)
Please inform the patient. CT neg for any acute abnormalities. It does show COPD and coronary atherosclerosis. He should follow-up with cardiology and pulmonary for that Send report to family physician

## 2020-10-08 ENCOUNTER — Telehealth: Payer: Self-pay | Admitting: General Surgery

## 2020-10-08 NOTE — Telephone Encounter (Signed)
-----   Message from Jackquline Denmark, MD sent at 10/04/2020  3:07 PM EST ----- Please inform the patient. CT neg for any acute abnormalities. It does show COPD and coronary atherosclerosis. He should follow-up with cardiology and pulmonary for that Send report to family physician

## 2020-10-08 NOTE — Telephone Encounter (Signed)
Tried to contact he patient to discuss CT findings, Phone rang then hung up.Will try to contact patient again later today.

## 2020-10-08 NOTE — Telephone Encounter (Signed)
Left a message for the patient to contact the office for results

## 2020-10-10 ENCOUNTER — Encounter: Payer: Self-pay | Admitting: Gastroenterology

## 2020-10-11 ENCOUNTER — Telehealth: Payer: Self-pay | Admitting: General Surgery

## 2020-10-11 ENCOUNTER — Encounter: Payer: Self-pay | Admitting: General Surgery

## 2020-10-11 NOTE — Telephone Encounter (Signed)
Sent a letter advising patient of CT scan and he needs to see Cardiology/pulmonology.

## 2020-10-25 ENCOUNTER — Other Ambulatory Visit: Payer: Self-pay

## 2021-04-24 ENCOUNTER — Ambulatory Visit: Payer: Medicaid Other | Admitting: Physician Assistant

## 2021-06-28 ENCOUNTER — Ambulatory Visit (INDEPENDENT_AMBULATORY_CARE_PROVIDER_SITE_OTHER): Payer: Medicaid Other | Admitting: Gastroenterology

## 2021-06-28 ENCOUNTER — Encounter: Payer: Self-pay | Admitting: Gastroenterology

## 2021-06-28 ENCOUNTER — Other Ambulatory Visit: Payer: Self-pay

## 2021-06-28 VITALS — BP 92/60 | HR 60 | Ht 70.0 in | Wt 116.0 lb

## 2021-06-28 DIAGNOSIS — R1310 Dysphagia, unspecified: Secondary | ICD-10-CM

## 2021-06-28 DIAGNOSIS — Z86010 Personal history of colonic polyps: Secondary | ICD-10-CM | POA: Diagnosis not present

## 2021-06-28 DIAGNOSIS — K582 Mixed irritable bowel syndrome: Secondary | ICD-10-CM | POA: Diagnosis not present

## 2021-06-28 DIAGNOSIS — K219 Gastro-esophageal reflux disease without esophagitis: Secondary | ICD-10-CM

## 2021-06-28 MED ORDER — ESOMEPRAZOLE MAGNESIUM 40 MG PO CPDR: 40.0000 mg | DELAYED_RELEASE_CAPSULE | Freq: Two times a day (BID) | ORAL | 11 refills | Status: AC

## 2021-06-28 MED ORDER — AMBULATORY NON FORMULARY MEDICATION
15.0000 mL | Freq: Four times a day (QID) | 2 refills | Status: DC | PRN
Start: 2021-06-28 — End: 2022-10-03

## 2021-06-28 MED ORDER — FAMOTIDINE 40 MG PO TABS: 40.0000 mg | ORAL_TABLET | Freq: Every day | ORAL | 11 refills | Status: DC

## 2021-06-28 NOTE — Progress Notes (Signed)
Chief Complaint: FU  Referring Provider:  Antonietta Jewel, MD      ASSESSMENT AND PLAN;   #1. GERD with occ dysphagia. Neg EGD 06/2019  #2.  Periumblical pain with neg CT C/A/P 09/2020 except for advanced COPD. Nl CBC, CMP, celiac serology, TSH  #3. IBS with alt diarrhea and costipation  #4. H/O advanced colon polyps.   Plan:  -Ba swallow with Ba tab. If abn, rpt EGD with dil. -Change protonix to nexium 40mg  po Bid #60, 11 reflils -Pepcid 40mg  po qhs to continue -Trial of GI cocktail 15 cc Q6 hrs prn #251ml -Stop smoking -Sister to make pulm appt. -If still with problems, CTA Abdo/pelvis followed by repeat discussion regarding colonoscopy with 2-day prep. -CT Abdo/pelvis/chest reports were given to patient and patient's sister. -FU in 12 weeks.   HPI:    Roberto Odom is a 55 y.o. male  With advanced COPD on CT (not on home O2) with continued smoking Accompanied by his sister, who is a patient of ours.  For follow-up visit.  Still with reflux despite Protonix twice a day and Pepcid at night.  Most recently over the last 3 months, he has been complaining of food getting hung up in the upper chest-mainly solids.  He has to occasionally wash it with liquids.  Continues to smoke despite medical advice.  Has been having periumbilical sharp abdominal pain.  No N/V. No jaundice dark urine or pale stools.  No fever chills or night sweats.  Has alternating diarrhea and constipation.  Also has lower abdominal discomfort.  Had colonoscopy as below with multiple polyps.  He was advised to get repeat colonoscopy in 6 months. Wants to hold off on colon.  Distressed with upper GI symptoms.  Underwent CT chest/Abdo/pelvis as below.  Radiology feels strongly that his weight loss is due to COPD.  He had normal CBC with hemoglobin 15.2, normal CMP, neg celiac screen and TSH (February 2022).  Wt Readings from Last 3 Encounters:  06/28/21 116 lb (52.6 kg)  09/26/20 119 lb 8 oz  (54.2 kg)  06/20/19 132 lb (59.9 kg)        Past GI procedures:  EGD 06/20/2019 -Mild gastritis. -Otherwise normal EGD. Neg HP  Colonoscopy 06/20/2019: -Significant colonic polyps s/p polypectomy. -Mild sigmoid diverticulosis. -Otherwise normal colonoscopy. -Limited due to quality of preparation. -Repeat: In 6 months.  Patient would like to hold off on colonoscopy (discussed again 06/28/2021)  CT chest/Abdo/pelvis with contrast 10/04/2020 1. No acute abnormality. No evidence of bowel obstruction or acute bowel inflammation. Normal appendix. 2. No lymphadenopathy or other potentially neoplastic findings to explain the patient's weight loss. 3. Severe centrilobular and paraseptal emphysema with mild diffuse bronchial wall thickening, suggesting COPD. 4. Two-vessel coronary atherosclerosis. 5. Aortic Atherosclerosis (ICD10-I70.0) and Emphysema (ICD10-J43.9).  Past Medical History:  Diagnosis Date   Anxiety    Depression    GERD (gastroesophageal reflux disease)    Pneumonia     Past Surgical History:  Procedure Laterality Date   thumb surgery Left    age 47 possibly     Family History  Problem Relation Age of Onset   Colon polyps Mother    Breast cancer Mother    Cervical cancer Mother    Irritable bowel syndrome Mother    Ovarian cancer Sister    Colon cancer Maternal Uncle    Prostate cancer Maternal Uncle    Colon cancer Maternal Uncle    Colon cancer Maternal Uncle    Colon  cancer Maternal Uncle    Irritable bowel syndrome Sister    Colon cancer Maternal Uncle    Colon cancer Maternal Uncle    Colon cancer Maternal Uncle    Esophageal cancer Neg Hx     Social History   Tobacco Use   Smoking status: Every Day   Smokeless tobacco: Current    Types: Snuff  Vaping Use   Vaping Use: Never used  Substance Use Topics   Alcohol use: Not Currently   Drug use: Not Currently    Current Outpatient Medications  Medication Sig Dispense Refill    Acetaminophen (TYLENOL PO) Take 1 tablet by mouth as needed.     cetirizine (ZYRTEC) 10 MG tablet Take 20 mg by mouth daily.     famotidine (PEPCID) 40 MG tablet Take 1 tablet (40 mg total) by mouth at bedtime. 30 tablet 11   fluticasone (FLONASE) 50 MCG/ACT nasal spray 2 sprays 2 (two) times daily.     Multiple Vitamins-Minerals (CENTRUM SILVER PO) Take 1 tablet by mouth daily.     pantoprazole (PROTONIX) 40 MG tablet Take 1 tablet (40 mg total) by mouth 2 (two) times daily. 60 tablet 11   No current facility-administered medications for this visit.    Allergies  Allergen Reactions   Ibuprofen     Rapid heartbeat     Review of Systems:  Constitutional: Denies fever, chills, diaphoresis, appetite change and fatigue.  HEENT: Denies photophobia, eye pain, redness, hearing loss, ear pain, congestion, sore throat, rhinorrhea, sneezing, mouth sores, neck pain, neck stiffness and tinnitus.   Respiratory: Denies SOB, DOE, cough, chest tightness,  and wheezing.   Cardiovascular: Denies chest pain, palpitations and leg swelling.  Genitourinary: Denies dysuria, urgency, frequency, hematuria, flank pain and difficulty urinating.  Musculoskeletal: Denies myalgias, back pain, joint swelling, arthralgias and gait problem.  Skin: No rash.  Neurological: Denies dizziness, seizures, syncope, weakness, light-headedness, numbness and headaches.  Hematological: Denies adenopathy. Easy bruising, personal or family bleeding history  Psychiatric/Behavioral: has anxiety or depression     Physical Exam:    BP 92/60   Pulse 60   Ht 5\' 10"  (1.778 m)   Wt 116 lb (52.6 kg)   SpO2 98%   BMI 16.64 kg/m  Filed Weights   06/28/21 1328  Weight: 116 lb (52.6 kg)   Constitutional: Thin built, in no acute distress. Psychiatric: Normal mood and affect. Behavior is normal. HEENT: Pupils normal.  Conjunctivae are normal. No scleral icterus. Neck supple.  Cardiovascular: Normal rate, regular rhythm. No  edema Pulmonary/chest: Effort normal and breath sounds decreased bilaterally.  No wheezing, rales or rhonchi. Abdominal: Soft, nondistended.  Mild epigastric tenderness. Bowel sounds active throughout. There are no masses palpable. No hepatomegaly.  Small supraumbilical hernia.  Nontender. Rectal:  defered Neurological: Alert and oriented to person place and time. Skin: Skin is warm and dry. No rashes noted.   Carmell Austria, MD 06/28/2021, 1:40 PM  Cc: Antonietta Jewel, MD

## 2021-06-28 NOTE — Patient Instructions (Addendum)
If you are age 57 or older, your body mass index should be between 23-30. Your Body mass index is 16.64 kg/m. If this is out of the aforementioned range listed, please consider follow up with your Primary Care Provider.  If you are age 41 or younger, your body mass index should be between 19-25. Your Body mass index is 16.64 kg/m. If this is out of the aformentioned range listed, please consider follow up with your Primary Care Provider.   ________________________________________________________  The [facility] providers would like to encourage you to use [facility] to communicate with providers for non-urgent requests or questions.  Due to long hold times on the telephone, sending your provider a message by [facility] may be a faster and more efficient way to get a response.  Please allow 48 business hours for a response.  Please remember that this is for non-urgent requests.  _______________________________________________________  We have sent the following medications to your pharmacy for you to pick up at your convenience: Nexium Pepcid GI cocktail-prevo drug  Please stop smoking  Please make an appointment with pulmonology.  Please call in 3 months to schedule an follow up appointment.  Call with any questions or concerns.  You have been scheduled for a Barium Esophogram at [facility] (1st floor of the hospital) on 07-05-2021 at 1030am. Please arrive 15 minutes prior to your appointment for registration. Make certain not to have anything to eat or drink 3 hours prior to your test. If you need to reschedule for any reason, please contact radiology at [phone] to do so. __________________________________________________________________ A barium swallow is an examination that concentrates on views of the esophagus. This tends to be a double contrast exam (barium and two liquids which, when combined, create a gas to distend the wall of the oesophagus) or single contrast  (non-ionic iodine based). The study is usually tailored to your symptoms so a good history is essential. Attention is paid during the study to the form, structure and configuration of the esophagus, looking for functional disorders (such as aspiration, dysphagia, achalasia, motility and reflux) EXAMINATION You may be asked to change into a gown, depending on the type of swallow being performed. A radiologist and radiographer will perform the procedure. The radiologist will advise you of the type of contrast selected for your procedure and direct you during the exam. You will be asked to stand, sit or lie in several different positions and to hold a small amount of fluid in your mouth before being asked to swallow while the imaging is performed .In some instances you may be asked to swallow barium coated marshmallows to assess the motility of a solid food bolus. The exam can be recorded as a digital or video fluoroscopy procedure. POST PROCEDURE It will take 1-2 days for the barium to pass through your system. To facilitate this, it is important, unless otherwise directed, to increase your fluids for the next 24-48hrs and to resume your normal diet.  This test typically takes about 30 minutes to perform. __________________________________________________________________________________   Thank you,  Dr. Jackquline Denmark

## 2021-07-05 ENCOUNTER — Ambulatory Visit (HOSPITAL_COMMUNITY): Admission: RE | Admit: 2021-07-05 | Payer: Medicaid Other | Source: Ambulatory Visit

## 2021-12-26 ENCOUNTER — Telehealth: Payer: Self-pay | Admitting: Gastroenterology

## 2021-12-26 NOTE — Telephone Encounter (Signed)
Good Afternoon Dr. Lyndel Safe,  Patient sister called wanting to scheduled patients colposcopy. After looking at patients chart he has a recall for 12/10/2020 and one for 06/11/2022. Patients last procedure was in 06/20/2019. Will you please advise on if patient can be scheduled for procedure?  Thank you.

## 2022-01-13 NOTE — Telephone Encounter (Signed)
Previous colonoscopy reviewed. Patient had limited preparation and advanced polyps It was recommended to repeat in 6 months with 2-day prep   Plan: -Proceed with colonoscopy with 2-day prep at [place]

## 2022-01-17 NOTE — Telephone Encounter (Signed)
LVM for patient or sister to call back to schedule procedure.

## 2022-08-01 ENCOUNTER — Encounter: Payer: Self-pay | Admitting: Gastroenterology

## 2022-10-03 ENCOUNTER — Other Ambulatory Visit (INDEPENDENT_AMBULATORY_CARE_PROVIDER_SITE_OTHER): Payer: Medicaid Other

## 2022-10-03 ENCOUNTER — Encounter: Payer: Self-pay | Admitting: Gastroenterology

## 2022-10-03 ENCOUNTER — Ambulatory Visit (INDEPENDENT_AMBULATORY_CARE_PROVIDER_SITE_OTHER): Payer: Medicaid Other | Admitting: Gastroenterology

## 2022-10-03 VITALS — BP 90/64 | HR 72 | Ht 70.0 in | Wt 115.2 lb

## 2022-10-03 DIAGNOSIS — Z86010 Personal history of colonic polyps: Secondary | ICD-10-CM | POA: Diagnosis not present

## 2022-10-03 DIAGNOSIS — K582 Mixed irritable bowel syndrome: Secondary | ICD-10-CM

## 2022-10-03 DIAGNOSIS — R1033 Periumbilical pain: Secondary | ICD-10-CM

## 2022-10-03 DIAGNOSIS — K219 Gastro-esophageal reflux disease without esophagitis: Secondary | ICD-10-CM | POA: Diagnosis not present

## 2022-10-03 DIAGNOSIS — R1013 Epigastric pain: Secondary | ICD-10-CM

## 2022-10-03 LAB — CBC WITH DIFFERENTIAL/PLATELET
Basophils Absolute: 0 10*3/uL (ref 0.0–0.1)
Basophils Relative: 0.7 % (ref 0.0–3.0)
Eosinophils Absolute: 0.2 10*3/uL (ref 0.0–0.7)
Eosinophils Relative: 2.4 % (ref 0.0–5.0)
HCT: 41.6 % (ref 39.0–52.0)
Hemoglobin: 13.8 g/dL (ref 13.0–17.0)
Lymphocytes Relative: 28.7 % (ref 12.0–46.0)
Lymphs Abs: 1.9 10*3/uL (ref 0.7–4.0)
MCHC: 33.2 g/dL (ref 30.0–36.0)
MCV: 88.1 fl (ref 78.0–100.0)
Monocytes Absolute: 0.6 10*3/uL (ref 0.1–1.0)
Monocytes Relative: 9 % (ref 3.0–12.0)
Neutro Abs: 3.9 10*3/uL (ref 1.4–7.7)
Neutrophils Relative %: 59.2 % (ref 43.0–77.0)
Platelets: 288 10*3/uL (ref 150.0–400.0)
RBC: 4.73 Mil/uL (ref 4.22–5.81)
RDW: 14.8 % (ref 11.5–15.5)
WBC: 6.5 10*3/uL (ref 4.0–10.5)

## 2022-10-03 LAB — COMPREHENSIVE METABOLIC PANEL
ALT: 12 U/L (ref 0–53)
AST: 20 U/L (ref 0–37)
Albumin: 4.4 g/dL (ref 3.5–5.2)
Alkaline Phosphatase: 55 U/L (ref 39–117)
BUN: 13 mg/dL (ref 6–23)
CO2: 30 mEq/L (ref 19–32)
Calcium: 9.7 mg/dL (ref 8.4–10.5)
Chloride: 102 mEq/L (ref 96–112)
Creatinine, Ser: 1 mg/dL (ref 0.40–1.50)
GFR: 82.77 mL/min (ref >60.00)
Glucose, Bld: 91 mg/dL (ref 70–99)
Potassium: 4.2 mEq/L (ref 3.5–5.1)
Sodium: 139 mEq/L (ref 135–145)
Total Bilirubin: 0.6 mg/dL (ref 0.2–1.2)
Total Protein: 7.1 g/dL (ref 6.0–8.3)

## 2022-10-03 LAB — LIPASE: Lipase: 17 U/L (ref 11.0–59.0)

## 2022-10-03 MED ORDER — ESOMEPRAZOLE MAGNESIUM 40 MG PO CPDR: 40.0000 mg | DELAYED_RELEASE_CAPSULE | Freq: Two times a day (BID) | ORAL | 3 refills | Status: DC

## 2022-10-03 NOTE — Progress Notes (Signed)
Chief Complaint: FU  Referring Provider:  Antonietta Jewel, MD      ASSESSMENT AND PLAN;   #1. Refractory GERD with occ epi pain. Neg EGD 06/2019. Neg CT C/A/P 09/2020 except for advanced COPD. Nl CBC, CMP, celiac serology, TSH  #2. IBS with alt diarrhea and costipation  #3. H/O advanced colon polyps.   Plan:  -Nexium '40mg'$  po BID -CBC, CMP, lipase.  -[place] abdo  -If still with problems, GES. -Colon with 2 day prep.    Discussed risks & benefits of colonoscopy. Risks including rare perforation req laparotomy, bleeding after bx/polypectomy req blood transfusion, rarely missing neoplasms, risks of anesthesia/sedation, rare risk of damage to internal organs. Benefits outweigh the risks. Patient agrees to proceed. All the questions were answered. Pt consents to proceed. HPI:    Roberto Odom is a 57 y.o. male  With advanced COPD on CT (not on home O2) with continued smoking Accompanied by his sister, who is a patient of ours.  For follow-up visit.  C/O epigastric discomfort associated with reflux despite Nexium 40 mg p.o. twice daily.  Protonix previously did not help.  He denies having any odynophagia or dysphagia.  He has quit smoking 3 months ago.  Has alternating diarrhea and constipation.  No melena or hematochezia.  No weight loss.  His appetite has improved ever since he has quit smoking.  He continues to use marijuana.  Willing to undergo colonoscopy.   Wt Readings from Last 3 Encounters:  10/03/22 115 lb 4 oz (52.3 kg)  06/28/21 116 lb (52.6 kg)  09/26/20 119 lb 8 oz (54.2 kg)      Past GI procedures:  EGD 06/20/2019 -Mild gastritis. -Otherwise normal EGD. Neg HP  Colonoscopy 06/20/2019: -Significant colonic polyps s/p polypectomy. -Mild sigmoid diverticulosis. -Otherwise normal colonoscopy. -Limited due to quality of preparation. -Repeat: In 6 months.  Patient would like to hold off on colonoscopy (discussed again 06/28/2021).   CT  chest/Abdo/pelvis with contrast 10/04/2020 1. No acute abnormality. No evidence of bowel obstruction or acute bowel inflammation. Normal appendix. 2. No lymphadenopathy or other potentially neoplastic findings to explain the patient's weight loss. 3. Severe centrilobular and paraseptal emphysema with mild diffuse bronchial wall thickening, suggesting COPD. 4. Two-vessel coronary atherosclerosis. 5. Aortic Atherosclerosis (ICD10-I70.0) and Emphysema (ICD10-J43.9).  Past Medical History:  Diagnosis Date   Anxiety    Depression    GERD (gastroesophageal reflux disease)    Pneumonia     Past Surgical History:  Procedure Laterality Date   thumb surgery Left    age 86 possibly     Family History  Problem Relation Age of Onset   Colon polyps Mother    Breast cancer Mother    Cervical cancer Mother    Irritable bowel syndrome Mother    Ovarian cancer Sister    Colon cancer Maternal Uncle    Prostate cancer Maternal Uncle    Colon cancer Maternal Uncle    Colon cancer Maternal Uncle    Colon cancer Maternal Uncle    Irritable bowel syndrome Sister    Colon cancer Maternal Uncle    Colon cancer Maternal Uncle    Colon cancer Maternal Uncle    Esophageal cancer Neg Hx     Social History   Tobacco Use   Smoking status: Every Day   Smokeless tobacco: Current    Types: Snuff  Vaping Use   Vaping Use: Never used  Substance Use Topics   Alcohol use: Not Currently   Drug  use: Not Currently    Current Outpatient Medications  Medication Sig Dispense Refill   albuterol (VENTOLIN HFA) 108 (90 Base) MCG/ACT inhaler Inhale 1-2 puffs into the lungs as needed for wheezing or shortness of breath.     aspirin EC 81 MG tablet Take 81 mg by mouth daily.     cetirizine (ZYRTEC) 10 MG tablet Take 20 mg by mouth daily.     esomeprazole (NEXIUM) 40 MG capsule Take 1 capsule (40 mg total) by mouth 2 (two) times daily. 60 capsule 11   fluticasone (FLONASE) 50 MCG/ACT nasal spray 2 sprays 2  (two) times daily.     gabapentin (NEURONTIN) 100 MG capsule Take 100 mg by mouth 3 (three) times daily.     montelukast (SINGULAIR) 10 MG tablet Take 10 mg by mouth at bedtime.     Multiple Vitamins-Minerals (CENTRUM SILVER PO) Take 1 tablet by mouth daily.     polyethylene glycol powder (GLYCOLAX) 17 GM/SCOOP powder Take 17 g by mouth as needed.     pravastatin (PRAVACHOL) 20 MG tablet Take 20 mg by mouth daily.     promethazine (PHENERGAN) 25 MG tablet Take 25 mg by mouth as needed for nausea or vomiting.     thiamine (VITAMIN B1) 100 MG tablet Take 100 mg by mouth daily.     traMADol (ULTRAM) 50 MG tablet Take 50 mg by mouth 3 (three) times daily as needed.     naloxone (NARCAN) nasal spray 4 mg/0.1 mL Place 1 spray into the nose as needed. (Patient not taking: Reported on 10/03/2022)     No current facility-administered medications for this visit.    Allergies  Allergen Reactions   Ibuprofen     Rapid heartbeat     Review of Systems:  Constitutional: Denies fever, chills, diaphoresis, appetite change and fatigue.  HEENT: Denies photophobia, eye pain, redness, hearing loss, ear pain, congestion, sore throat, rhinorrhea, sneezing, mouth sores, neck pain, neck stiffness and tinnitus.   Respiratory: Denies SOB, DOE, cough, chest tightness,  and wheezing.   Cardiovascular: Denies chest pain, palpitations and leg swelling.  Genitourinary: Denies dysuria, urgency, frequency, hematuria, flank pain and difficulty urinating.  Musculoskeletal: Denies myalgias, back pain, joint swelling, arthralgias and gait problem.  Skin: No rash.  Neurological: Denies dizziness, seizures, syncope, weakness, light-headedness, numbness and headaches.  Hematological: Denies adenopathy. Easy bruising, personal or family bleeding history  Psychiatric/Behavioral: has anxiety or depression     Physical Exam:    BP 90/64 (BP Location: Left Arm, Patient Position: Sitting, Cuff Size: Normal)   Pulse 72   Ht 5'  10" (1.778 m)   Wt 115 lb 4 oz (52.3 kg)   BMI 16.54 kg/m  Filed Weights   10/03/22 1033  Weight: 115 lb 4 oz (52.3 kg)   Constitutional: Thin built, in no acute distress. Psychiatric: Normal mood and affect. Behavior is normal. HEENT: Pupils normal.  Conjunctivae are normal. No scleral icterus. Cardiovascular: Normal rate, regular rhythm. No edema Pulmonary/chest: Effort normal and breath sounds decreased bilaterally.  No wheezing, rales or rhonchi. Abdominal: Soft, nondistended.  Mild epigastric tenderness. Bowel sounds active throughout. There are no masses palpable. No hepatomegaly.  Small supraumbilical hernia.  Nontender. Rectal:  defered Neurological: Alert and oriented to person place and time. Skin: Skin is warm and dry. No rashes noted.   Carmell Austria, MD 10/03/2022, 10:58 AM  Cc: Antonietta Jewel, MD

## 2022-10-03 NOTE — Patient Instructions (Addendum)
_______________________________________________________  If your blood pressure at your visit was 140/90 or greater, please contact your primary care physician to follow up on this.  _______________________________________________________  If you are age 60 or older, your body mass index should be between 23-30. Your Body mass index is 16.54 kg/m. If this is out of the aforementioned range listed, please consider follow up with your Primary Care Provider.  If you are age 64 or younger, your body mass index should be between 19-25. Your Body mass index is 16.54 kg/m. If this is out of the aformentioned range listed, please consider follow up with your Primary Care Provider.   ________________________________________________________  The [facility] providers would like to encourage you to use [facility] to communicate with providers for non-urgent requests or questions.  Due to long hold times on the telephone, sending your provider a message by [facility] may be a faster and more efficient way to get a response.  Please allow 48 business hours for a response.  Please remember that this is for non-urgent requests.  _______________________________________________________   Your provider has requested that you go to the basement level for lab work before leaving today. Press "B" on the elevator. The lab is located at the first door on the left as you exit the elevator.  We have sent the following medications to your pharmacy for you to pick up at your convenience: Nexium 2 times a day   You have been scheduled for an abdominal ultrasound at [facility] (1st floor of hospital) on 10-09-2022 at [place]. Please arrive 30 minutes prior to your appointment for registration. Make certain not to have anything to eat or drink midnight prior to your appointment. Should you need to reschedule your appointment, please contact radiology at [phone]. This test typically takes about 30 minutes to  perform.  You have been scheduled for a colonoscopy. Please follow written instructions given to you at your visit today.  Please pick up your prep supplies at the pharmacy within the next 1-3 days. If you use inhalers (even only as needed), please bring them with you on the day of your procedure.  Two days before your procedure: Mix 3 packs (or capfuls) of Miralax in 48 ounces of clear liquid and drink at 6pm.  Thank you,  Dr. Jackquline Denmark

## 2022-10-09 ENCOUNTER — Ambulatory Visit (HOSPITAL_COMMUNITY)
Admission: RE | Admit: 2022-10-09 | Discharge: 2022-10-09 | Disposition: A | Discharge status: Home / Self Care | Payer: Medicaid Other | Source: Ambulatory Visit | Attending: Gastroenterology | Admitting: Gastroenterology

## 2022-10-09 DIAGNOSIS — R1013 Epigastric pain: Secondary | ICD-10-CM | POA: Insufficient documentation

## 2022-10-09 DIAGNOSIS — K219 Gastro-esophageal reflux disease without esophagitis: Secondary | ICD-10-CM | POA: Diagnosis present

## 2022-10-09 DIAGNOSIS — K582 Mixed irritable bowel syndrome: Secondary | ICD-10-CM | POA: Diagnosis present

## 2022-10-09 DIAGNOSIS — Z86010 Personal history of colonic polyps: Secondary | ICD-10-CM | POA: Diagnosis present

## 2022-10-09 DIAGNOSIS — R1033 Periumbilical pain: Secondary | ICD-10-CM | POA: Diagnosis present

## 2022-10-14 ENCOUNTER — Telehealth: Payer: Self-pay | Admitting: Gastroenterology

## 2022-10-14 NOTE — Telephone Encounter (Signed)
Spoke to pt sister Mateo Flow.  Documented in result notes.  Mateo Flow verbalized understanding with all questions answered.

## 2022-10-14 NOTE — Telephone Encounter (Signed)
Patient is returning Steven's call regarding [place] results

## 2022-10-20 ENCOUNTER — Encounter: Payer: Self-pay | Admitting: Gastroenterology

## 2022-10-20 ENCOUNTER — Ambulatory Visit (AMBULATORY_SURGERY_CENTER): Payer: Medicaid Other | Admitting: Gastroenterology

## 2022-10-20 VITALS — BP 118/62 | HR 57 | Temp 97.5°F | Resp 13 | Ht 70.0 in | Wt 115.0 lb

## 2022-10-20 DIAGNOSIS — D123 Benign neoplasm of transverse colon: Secondary | ICD-10-CM

## 2022-10-20 DIAGNOSIS — Z09 Encounter for follow-up examination after completed treatment for conditions other than malignant neoplasm: Secondary | ICD-10-CM | POA: Diagnosis not present

## 2022-10-20 DIAGNOSIS — Z86010 Personal history of colonic polyps: Secondary | ICD-10-CM | POA: Diagnosis not present

## 2022-10-20 DIAGNOSIS — K635 Polyp of colon: Secondary | ICD-10-CM | POA: Diagnosis not present

## 2022-10-20 DIAGNOSIS — D125 Benign neoplasm of sigmoid colon: Secondary | ICD-10-CM | POA: Diagnosis not present

## 2022-10-20 MED ORDER — SODIUM CHLORIDE 0.9 % IV SOLN: 500.0000 mL | Freq: Once | INTRAVENOUS | Status: DC

## 2022-10-20 NOTE — Patient Instructions (Addendum)
Resume previous diet Continue present medications NO  NSAIDS (Non-Steroidal anti-inflammatory drugs) for 5 days.  (These include, aspirin, aspirin-containing products(products containing salicylic acid like Pepto Bismol and Alka Seltzer), ibuprofen, advil, motrin, naproxen, aleve, goody powders, etc) Tylenol is ok to take as needed, see label for instructions. Await pathology results Handouts given for polyps, diverticulosis and hemorrhoids.  YOU HAD AN ENDOSCOPIC PROCEDURE TODAY AT [facility]:   Refer to the procedure report that was given to you for any specific questions about what was found during the examination.  If the procedure report does not answer your questions, please call your gastroenterologist to clarify.  If you requested that your care partner not be given the details of your procedure findings, then the procedure report has been included in a sealed envelope for you to review at your convenience later.  YOU SHOULD EXPECT: Some feelings of bloating in the abdomen. Passage of more gas than usual.  Walking can help get rid of the air that was put into your GI tract during the procedure and reduce the bloating. If you had a lower endoscopy (such as a colonoscopy or flexible sigmoidoscopy) you may notice spotting of blood in your stool or on the toilet paper. If you underwent a bowel prep for your procedure, you may not have a normal bowel movement for a few days.  Please Note:  You might notice some irritation and congestion in your nose or some drainage.  This is from the oxygen used during your procedure.  There is no need for concern and it should clear up in a day or so.  SYMPTOMS TO REPORT IMMEDIATELY:  Following lower endoscopy (colonoscopy or flexible sigmoidoscopy):  Excessive amounts of blood in the stool  Significant tenderness or worsening of abdominal pains  Swelling of the abdomen that is new, acute  Fever of 100F or higher  For urgent or emergent  issues, a gastroenterologist can be reached at any hour by calling [phone]. Do not use MyChart messaging for urgent concerns.    DIET:  We do recommend a small meal at first, but then you may proceed to your regular diet.  Drink plenty of fluids but you should avoid alcoholic beverages for 24 hours.  ACTIVITY:  You should plan to take it easy for the rest of today and you should NOT DRIVE or use heavy machinery until tomorrow (because of the sedation medicines used during the test).    FOLLOW UP: Our staff will call the number listed on your records the next business day following your procedure.  We will call around 7:15- 8:00 am to check on you and address any questions or concerns that you may have regarding the information given to you following your procedure. If we do not reach you, we will leave a message.     If any biopsies were taken you will be contacted by phone or by letter within the next 1-3 weeks.  Please call us at [phone] if you have not heard about the biopsies in 3 weeks.    SIGNATURES/CONFIDENTIALITY: You and/or your care partner have signed paperwork which will be entered into your electronic medical record.  These signatures attest to the fact that that the information above on your After Visit Summary has been reviewed and is understood.  Full responsibility of the confidentiality of this discharge information lies with you and/or your care-partner.

## 2022-10-20 NOTE — Progress Notes (Signed)
To pacu, VSS. Report to [email or web address] 

## 2022-10-20 NOTE — Op Note (Signed)
[facility] Patient Name: Roberto Odom Procedure Date: 10/20/2022 11:29 AM MRN: [id number] Endoscopist: Jackquline Denmark , MD, [id number] Age: 57 Referring MD:  Date of Birth: 01-03-1966 Gender: Male Account #: [id number] Procedure:                Colonoscopy Indications:              High risk colon cancer surveillance: Personal                            history of colonic polyps Medicines:                Monitored Anesthesia Care Procedure:                Pre-Anesthesia Assessment:                           - Prior to the procedure, a History and Physical                            was performed, and patient medications and                            allergies were reviewed. The patient's tolerance of                            previous anesthesia was also reviewed. The risks                            and benefits of the procedure and the sedation                            options and risks were discussed with the patient.                            All questions were answered, and informed consent                            was obtained. Prior Anticoagulants: The patient has                            taken no anticoagulant or antiplatelet agents. ASA                            Grade Assessment: II - A patient with mild systemic                            disease. After reviewing the risks and benefits,                            the patient was deemed in satisfactory condition to                            undergo the procedure.  After obtaining informed consent, the colonoscope                            was passed under direct vision. Throughout the                            procedure, the patient's blood pressure, pulse, and                            oxygen saturations were monitored continuously. The                            Sonterra (SN [id number]): Was introduced                            through the anus and advanced to the  the cecum,                            identified by appendiceal orifice and ileocecal                            valve. The colonoscopy was performed without                            difficulty. The patient tolerated the procedure                            well. The quality of the bowel preparation was                            adequate to identify polyps-despite 2-day prep,                            some retained stool. The ileocecal valve,                            appendiceal orifice, and rectum were photographed. Scope In: 11:34:52 AM Scope Out: [id number] AM Scope Withdrawal Time: 0 hours 11 minutes 43 seconds  Total Procedure Duration: 0 hours 14 minutes 25 seconds  Findings:                 A 10 mm polyp was found in the proximal transverse                            colon. The polyp was sessile. The polyp was removed                            with a piecemeal technique using a cold snare.                            Resection and retrieval were complete.                           Three sessile polyps were found  in the proximal                            sigmoid colon and mid sigmoid colon. The polyps                            were 5 to 6 mm in size. These polyps were removed                            with a cold snare. Resection and retrieval were                            complete.                           A few small-mouthed diverticula were found in the                            sigmoid colon.                           Non-bleeding internal hemorrhoids were found during                            retroflexion. The hemorrhoids were small and Grade                            I (internal hemorrhoids that do not prolapse).                           The exam was otherwise without abnormality on                            direct and retroflexion views. Complications:            No immediate complications. Estimated Blood Loss:     Estimated blood loss: none. Impression:                - One 10 mm polyp in the proximal transverse colon,                            removed piecemeal using a cold snare. Resected and                            retrieved.                           - Three 5 to 6 mm polyps in the proximal sigmoid                            colon and in the mid sigmoid colon, removed with a                            cold snare. Resected and retrieved.                           -  Mild sigmoid diverticulosis.                           - Non-bleeding internal hemorrhoids.                           - The examination was otherwise normal on direct                            and retroflexion views. Recommendation:           - Patient has a contact number available for                            emergencies. The signs and symptoms of potential                            delayed complications were discussed with the                            patient. Return to normal activities tomorrow.                            Written discharge instructions were provided to the                            patient.                           - Resume previous diet.                           - Continue present medications.                           - Await pathology results.                           - Avoid nonsteroidals x 5 days                           - Repeat colonoscopy for surveillance based on                            pathology results. Would require 2-day prep for any                            future colonoscopies were                           - The findings and recommendations were discussed                            with the patient's family. Jackquline Denmark, MD 10/20/2022 11:56:21 AM This report has been signed electronically.

## 2022-10-20 NOTE — Progress Notes (Signed)
Chief Complaint: FU  Referring Provider:  Antonietta Jewel, MD      ASSESSMENT AND PLAN;   #1. Refractory GERD with occ epi pain. Neg EGD 06/2019. Neg CT C/A/P 09/2020 except for advanced COPD. Nl CBC, CMP, celiac serology, TSH  #2. IBS with alt diarrhea and costipation  #3. H/O advanced colon polyps.   Plan:  -Nexium '40mg'$  po BID -CBC, CMP, lipase.  -[place] abdo  -If still with problems, GES. -Colon with 2 day prep.    Discussed risks & benefits of colonoscopy. Risks including rare perforation req laparotomy, bleeding after bx/polypectomy req blood transfusion, rarely missing neoplasms, risks of anesthesia/sedation, rare risk of damage to internal organs. Benefits outweigh the risks. Patient agrees to proceed. All the questions were answered. Pt consents to proceed. HPI:    Roberto Odom is a 57 y.o. male  With advanced COPD on CT (not on home O2) with continued smoking Accompanied by his sister, who is a patient of ours.  For follow-up visit.  C/O epigastric discomfort associated with reflux despite Nexium 40 mg p.o. twice daily.  Protonix previously did not help.  He denies having any odynophagia or dysphagia.  He has quit smoking 3 months ago.  Has alternating diarrhea and constipation.  No melena or hematochezia.  No weight loss.  His appetite has improved ever since he has quit smoking.  He continues to use marijuana.  Willing to undergo colonoscopy.   Wt Readings from Last 3 Encounters:  10/20/22 115 lb (52.2 kg)  10/03/22 115 lb 4 oz (52.3 kg)  06/28/21 116 lb (52.6 kg)      Past GI procedures:  EGD 06/20/2019 -Mild gastritis. -Otherwise normal EGD. Neg HP  Colonoscopy 06/20/2019: -Significant colonic polyps s/p polypectomy. -Mild sigmoid diverticulosis. -Otherwise normal colonoscopy. -Limited due to quality of preparation. -Repeat: In 6 months.  Patient would like to hold off on colonoscopy (discussed again 06/28/2021).   CT  chest/Abdo/pelvis with contrast 10/04/2020 1. No acute abnormality. No evidence of bowel obstruction or acute bowel inflammation. Normal appendix. 2. No lymphadenopathy or other potentially neoplastic findings to explain the patient's weight loss. 3. Severe centrilobular and paraseptal emphysema with mild diffuse bronchial wall thickening, suggesting COPD. 4. Two-vessel coronary atherosclerosis. 5. Aortic Atherosclerosis (ICD10-I70.0) and Emphysema (ICD10-J43.9).  Past Medical History:  Diagnosis Date   Anxiety    Depression    GERD (gastroesophageal reflux disease)    Pneumonia     Past Surgical History:  Procedure Laterality Date   thumb surgery Left    age 33 possibly     Family History  Problem Relation Age of Onset   Colon polyps Mother    Breast cancer Mother    Cervical cancer Mother    Irritable bowel syndrome Mother    Ovarian cancer Sister    Colon cancer Maternal Uncle    Prostate cancer Maternal Uncle    Colon cancer Maternal Uncle    Colon cancer Maternal Uncle    Colon cancer Maternal Uncle    Irritable bowel syndrome Sister    Colon cancer Maternal Uncle    Colon cancer Maternal Uncle    Colon cancer Maternal Uncle    Esophageal cancer Neg Hx     Social History   Tobacco Use   Smoking status: Every Day   Smokeless tobacco: Current    Types: Snuff  Vaping Use   Vaping Use: Never used  Substance Use Topics   Alcohol use: Not Currently   Drug use: Not  Currently    Current Outpatient Medications  Medication Sig Dispense Refill   albuterol (VENTOLIN HFA) 108 (90 Base) MCG/ACT inhaler Inhale 1-2 puffs into the lungs as needed for wheezing or shortness of breath.     aspirin EC 81 MG tablet Take 81 mg by mouth daily.     cetirizine (ZYRTEC) 10 MG tablet Take 20 mg by mouth daily.     esomeprazole (NEXIUM) 40 MG capsule Take 1 capsule (40 mg total) by mouth 2 (two) times daily. 60 capsule 11   esomeprazole (NEXIUM) 40 MG capsule Take 1 capsule (40 mg  total) by mouth 2 (two) times daily before a meal. 180 capsule 3   montelukast (SINGULAIR) 10 MG tablet Take 10 mg by mouth at bedtime.     Multiple Vitamins-Minerals (CENTRUM SILVER PO) Take 1 tablet by mouth daily.     pravastatin (PRAVACHOL) 20 MG tablet Take 20 mg by mouth daily.     promethazine (PHENERGAN) 25 MG tablet Take 25 mg by mouth as needed for nausea or vomiting.     thiamine (VITAMIN B1) 100 MG tablet Take 100 mg by mouth daily.     traMADol (ULTRAM) 50 MG tablet Take 50 mg by mouth 3 (three) times daily as needed.     fluticasone (FLONASE) 50 MCG/ACT nasal spray 2 sprays 2 (two) times daily.     gabapentin (NEURONTIN) 100 MG capsule Take 100 mg by mouth 3 (three) times daily.     naloxone (NARCAN) nasal spray 4 mg/0.1 mL Place 1 spray into the nose as needed. (Patient not taking: Reported on 10/03/2022)     polyethylene glycol powder (GLYCOLAX) 17 GM/SCOOP powder Take 17 g by mouth as needed.     Current Facility-Administered Medications  Medication Dose Route Frequency Provider Last Rate Last Admin   0.9 %  sodium chloride infusion  500 mL Intravenous Once Jackquline Denmark, MD        Allergies  Allergen Reactions   Ibuprofen     Rapid heartbeat     Review of Systems:  Constitutional: Denies fever, chills, diaphoresis, appetite change and fatigue.  HEENT: Denies photophobia, eye pain, redness, hearing loss, ear pain, congestion, sore throat, rhinorrhea, sneezing, mouth sores, neck pain, neck stiffness and tinnitus.   Respiratory: Denies SOB, DOE, cough, chest tightness,  and wheezing.   Cardiovascular: Denies chest pain, palpitations and leg swelling.  Genitourinary: Denies dysuria, urgency, frequency, hematuria, flank pain and difficulty urinating.  Musculoskeletal: Denies myalgias, back pain, joint swelling, arthralgias and gait problem.  Skin: No rash.  Neurological: Denies dizziness, seizures, syncope, weakness, light-headedness, numbness and headaches.   Hematological: Denies adenopathy. Easy bruising, personal or family bleeding history  Psychiatric/Behavioral: has anxiety or depression     Physical Exam:    BP 122/66 (BP Location: Right Arm, Patient Position: Sitting, Cuff Size: Normal)   Pulse 67   Temp (!) 97.5 F (36.4 C) (Temporal)   Ht '5\' 10"'$  (1.778 m)   Wt 115 lb (52.2 kg)   SpO2 100%   BMI 16.50 kg/m  Filed Weights   10/20/22 1056  Weight: 115 lb (52.2 kg)   Constitutional: Thin built, in no acute distress. Psychiatric: Normal mood and affect. Behavior is normal. HEENT: Pupils normal.  Conjunctivae are normal. No scleral icterus. Cardiovascular: Normal rate, regular rhythm. No edema Pulmonary/chest: Effort normal and breath sounds decreased bilaterally.  No wheezing, rales or rhonchi. Abdominal: Soft, nondistended.  Mild epigastric tenderness. Bowel sounds active throughout. There are no masses palpable.  No hepatomegaly.  Small supraumbilical hernia.  Nontender. Rectal:  defered Neurological: Alert and oriented to person place and time. Skin: Skin is warm and dry. No rashes noted.   Carmell Austria, MD 10/20/2022, 11:29 AM  Cc: Antonietta Jewel, MD

## 2022-10-20 NOTE — Progress Notes (Signed)
Called to room to assist during endoscopic procedure.  Patient ID and intended procedure confirmed with present staff. Received instructions for my participation in the procedure from the performing physician.  

## 2022-10-20 NOTE — Progress Notes (Signed)
Vitals-CW  Pt's states no medical or surgical changes since previsit or office visit. 

## 2022-10-21 ENCOUNTER — Telehealth: Payer: Self-pay | Admitting: *Deleted

## 2022-10-21 NOTE — Telephone Encounter (Signed)
  Follow up Call-     10/20/2022   11:05 AM 10/20/2022   10:56 AM  Call back number  Post procedure Call Back phone  # [phone] ister vleriw   Permission to leave phone message  Yes    Spoke with sister Patient questions:  Do you have a fever, pain , or abdominal swelling? No. Pain Score  0 *  Have you tolerated food without any problems? Yes.    Have you been able to return to your normal activities? Yes.    Do you have any questions about your discharge instructions: Diet   No. Medications  No. Follow up visit  No.  Do you have questions or concerns about your Care? No.  Actions: * If pain score is 4 or above: No action needed, pain <4.

## 2022-10-26 ENCOUNTER — Encounter: Payer: Self-pay | Admitting: Gastroenterology

## 2022-11-10 IMAGING — CT CT CHEST W/ CM
2 of 5 series · 12 of 36 positions shown, 15 images · IV contrast (omnipaque)
Comparison: 09/26/2020 chest radiograph.  04/28/2007 chest CT.

CLINICAL DATA: Periumbilical abdominal pain and cramping for months
with unintentional weight loss 15 pounds. Irritable bowel with
constipation and diarrhea. Gastroesophageal reflux disease. Current
smoker per [REDACTED].

EXAM:
CT CHEST, ABDOMEN, AND PELVIS WITH CONTRAST
TECHNIQUE: Multidetector CT imaging of the chest, abdomen and pelvis was
performed following the standard protocol during bolus
administration of intravenous contrast.
CONTRAST:  100mL OMNIPAQUE IOHEXOL 300 MG/ML  SOLN

[Series 2: cap with 2 · axial · 0.79mm/px · z∈[-689,-89]mm · 9 of 148 slices shown, 12 images]
[im 14/148  mediastinal]
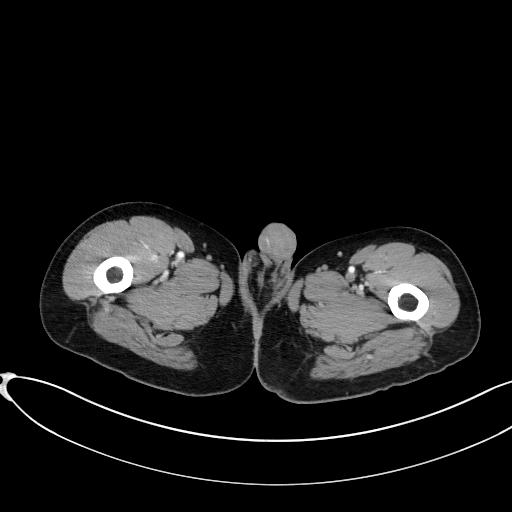
[im 14/148  lung]
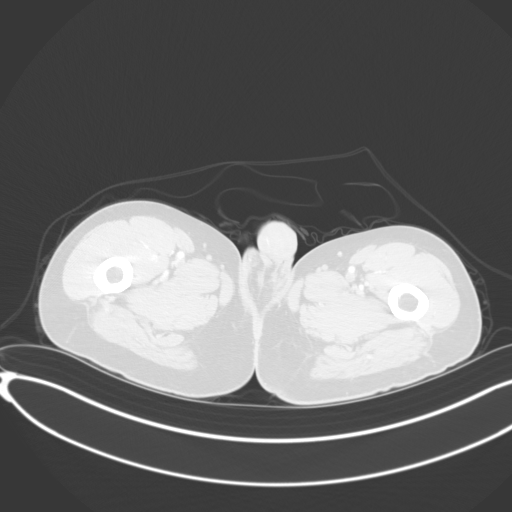
[im 27/148  lung]
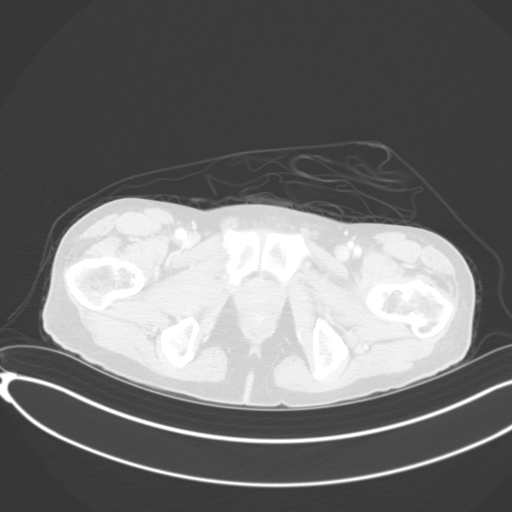
[im 41/148  lung]
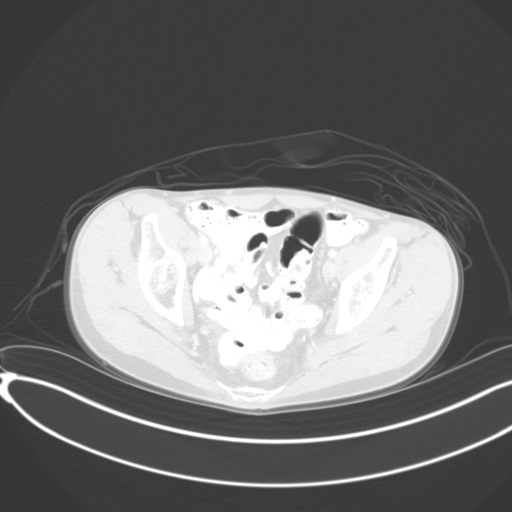
[im 54/148  lung]
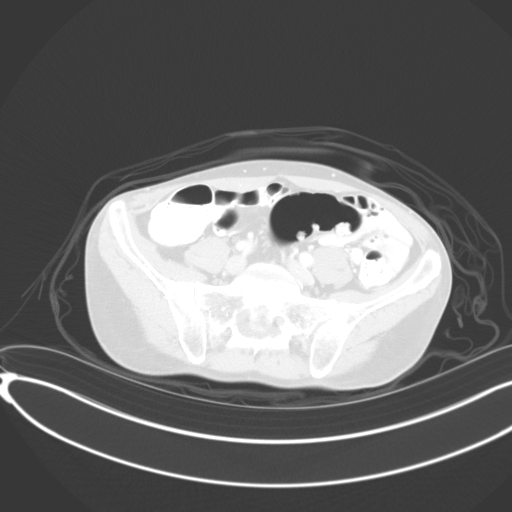
[im 81/148  mediastinal]
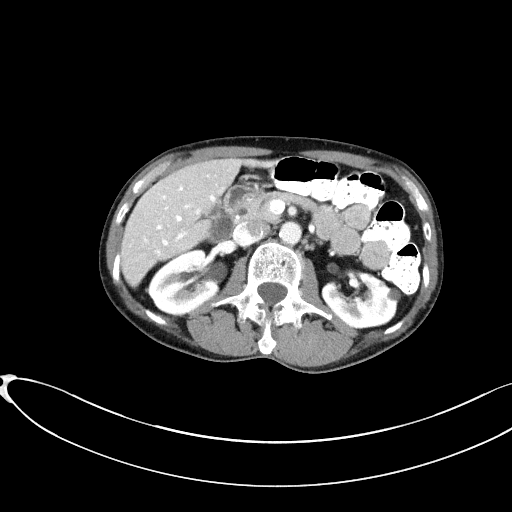
[im 81/148  lung]
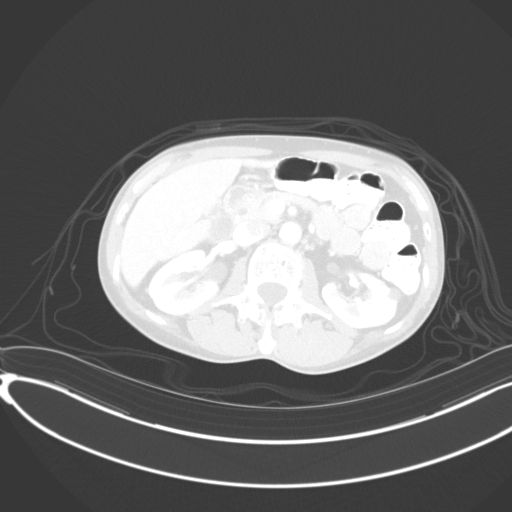
[im 94/148  lung]
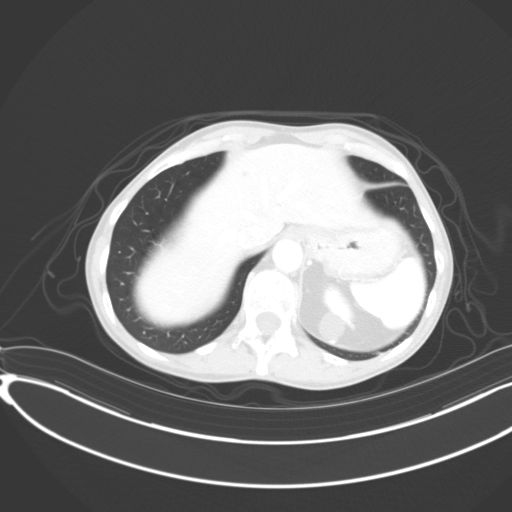
[im 107/148  lung]
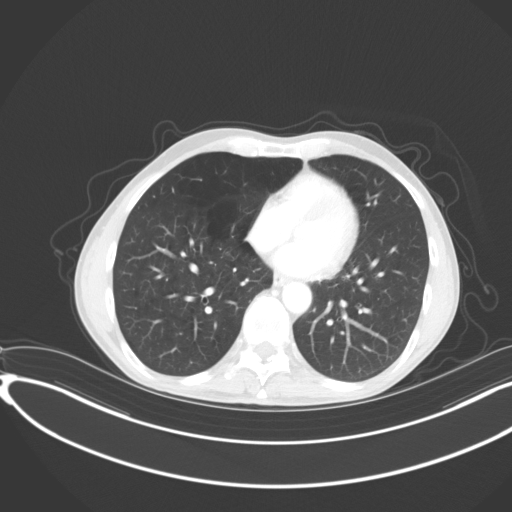
[im 121/148  lung]
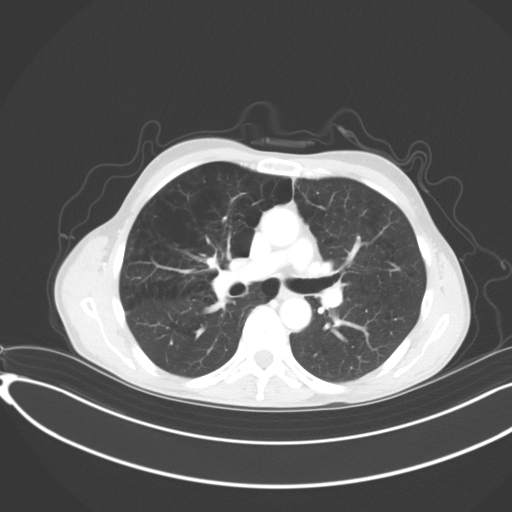
[im 134/148  mediastinal]
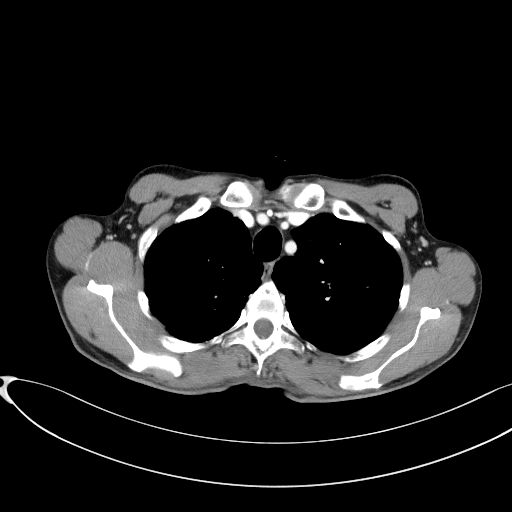
[im 134/148  lung]
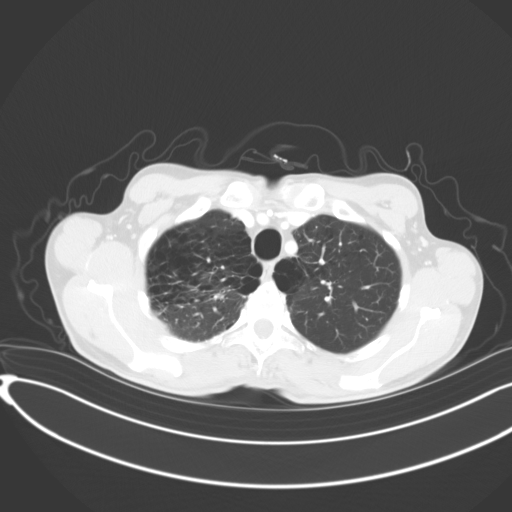

[Series 5: coronals · coronal · 0.74mm/px · 3 of 117 slices shown]
[im 24/117  lung]
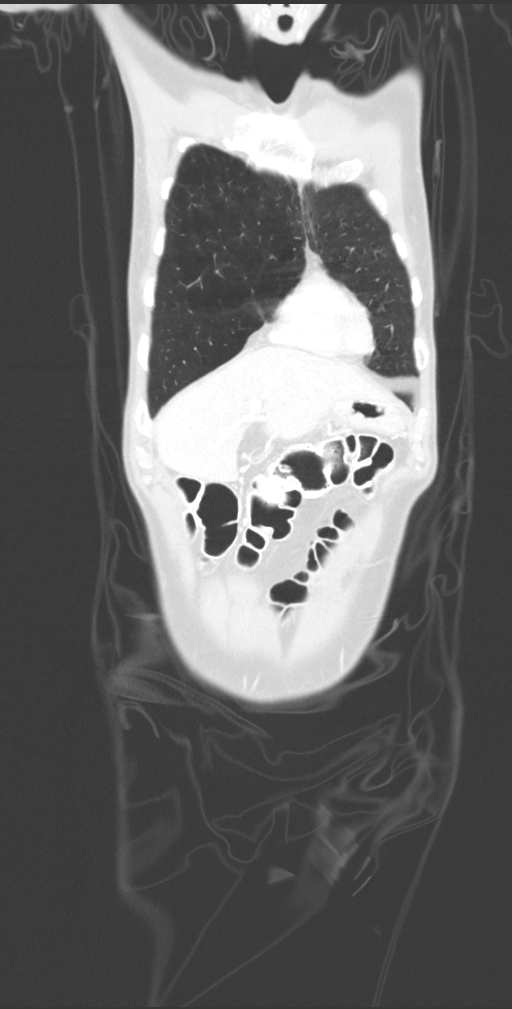
[im 47/117  lung]
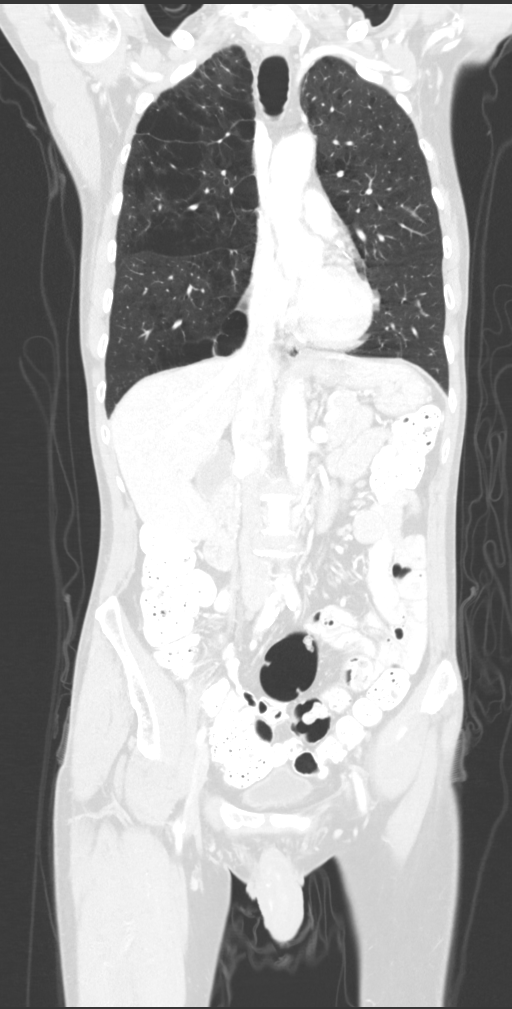
[im 70/117  lung]
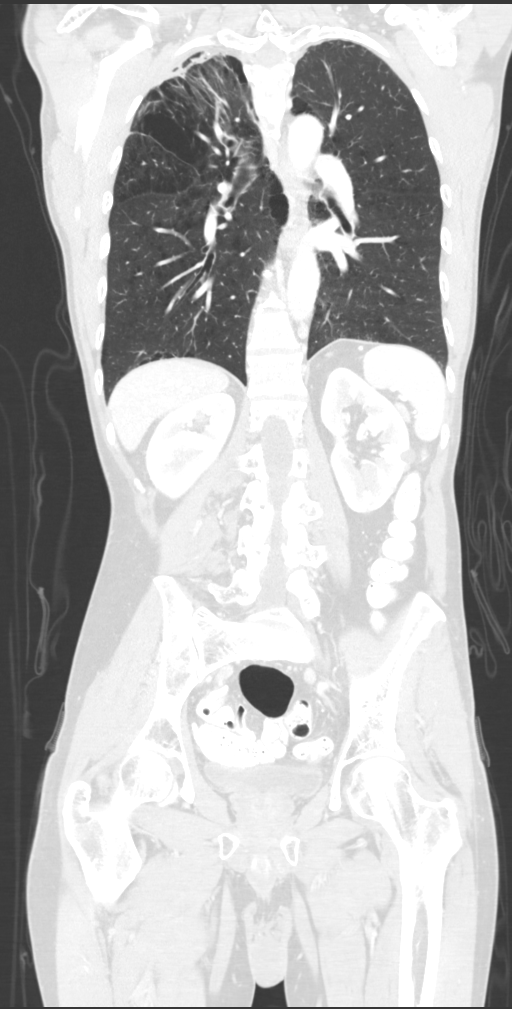

[12 of 36 positions shown; findings below may reference images not displayed]

FINDINGS: CT CHEST FINDINGS

Cardiovascular: Normal heart size. No significant pericardial
effusion/thickening. Left anterior descending and left circumflex
coronary atherosclerosis. Atherosclerotic nonaneurysmal thoracic
aorta. Normal caliber pulmonary arteries. No central pulmonary
emboli.

Mediastinum/Nodes: No discrete thyroid nodules. Unremarkable
esophagus. No pathologically enlarged axillary, mediastinal or hilar
lymph nodes.

Lungs/Pleura: No pneumothorax. No pleural effusion. Severe
centrilobular and paraseptal emphysema with mild diffuse bronchial
wall thickening. No acute consolidative airspace disease, lung
masses or significant pulmonary nodules. Curvilinear biapical mild
pleuroparenchymal scarring, right greater than left.

Musculoskeletal: No aggressive appearing focal osseous lesions. Mild
thoracic spondylosis.

CT ABDOMEN PELVIS FINDINGS

Hepatobiliary: Normal liver size. Several subcentimeter hypodense
liver lesions scattered throughout the liver, too small to
characterize, requiring no follow-up unless the patient risk factors
for liver malignancy. No overtly suspicious liver masses. Normal
gallbladder with no radiopaque cholelithiasis. No biliary ductal
dilatation.

Pancreas: Normal, with no mass or duct dilation.

Spleen: Normal size. No mass.

Adrenals/Urinary Tract: Normal adrenals. No hydronephrosis.
Exophytic simple 2.2 cm posterior upper left renal cyst. Simple
cm upper right renal cyst. Several subcentimeter hypodense renal
cortical lesions scattered in the left kidney, too small to
characterize, requiring no follow-up. Normal bladder.

Stomach/Bowel: Normal non-distended stomach. Normal caliber small
bowel with no small bowel wall thickening. Normal appendix. Oral
contrast transits to distal colon. Normal large bowel with no
diverticulosis, large bowel wall thickening or pericolonic fat
stranding.

Vascular/Lymphatic: Atherosclerotic nonaneurysmal abdominal aorta.
Patent portal, splenic, hepatic and renal veins. No pathologically
enlarged lymph nodes in the abdomen or pelvis.

Reproductive: Top-normal size prostate.

Other: No pneumoperitoneum, ascites or focal fluid collection.

Musculoskeletal: No aggressive appearing focal osseous lesions. Mild
lumbar spondylosis.
IMPRESSION: 1. No acute abnormality. No evidence of bowel obstruction or acute
bowel inflammation. Normal appendix.
2. No lymphadenopathy or other potentially neoplastic findings to
explain the patient's weight loss.
3. Severe centrilobular and paraseptal emphysema with mild diffuse
bronchial wall thickening, suggesting COPD.
4. Two-vessel coronary atherosclerosis.
5. Aortic Atherosclerosis (VM2P8-W9E.E) and Emphysema (VM2P8-IW6.6).

## 2022-11-10 IMAGING — CT CT ABD-PELV W/ CM
2 of 5 series · 12 of 36 positions shown, 15 images · IV contrast (Omnipaque)
Comparison: 09/26/2020 chest radiograph.  04/28/2007 chest CT.

CLINICAL DATA: Periumbilical abdominal pain and cramping for months
with unintentional weight loss 15 pounds. Irritable bowel with
constipation and diarrhea. Gastroesophageal reflux disease. Current
smoker per [REDACTED].

EXAM:
CT CHEST, ABDOMEN, AND PELVIS WITH CONTRAST
TECHNIQUE: Multidetector CT imaging of the chest, abdomen and pelvis was
performed following the standard protocol during bolus
administration of intravenous contrast.
CONTRAST:  100mL OMNIPAQUE IOHEXOL 300 MG/ML  SOLN

[Series 2: cap with 2 · axial · 0.79mm/px · z∈[-689,-89]mm · 9 of 148 slices shown, 12 images]
[im 14/148  mediastinal]
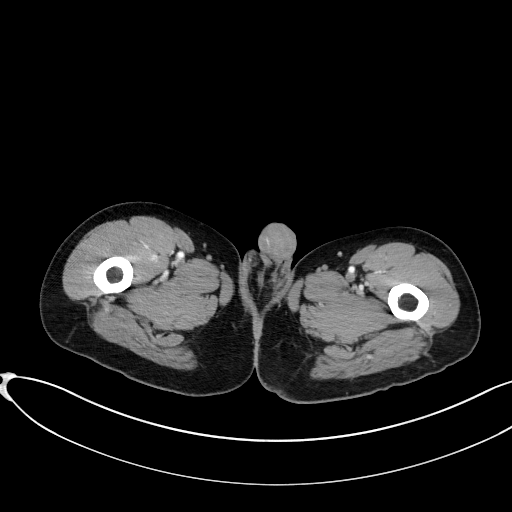
[im 14/148  lung]
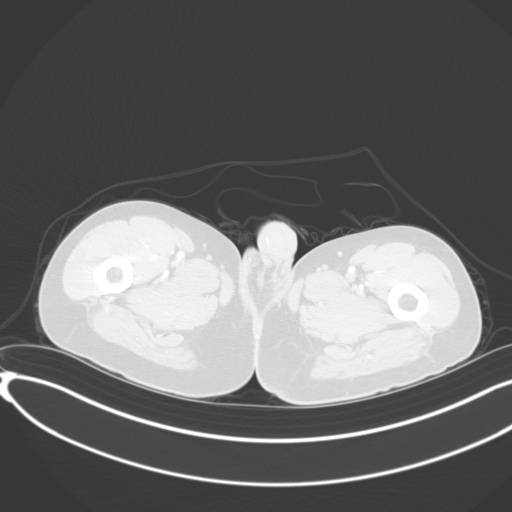
[im 27/148  lung]
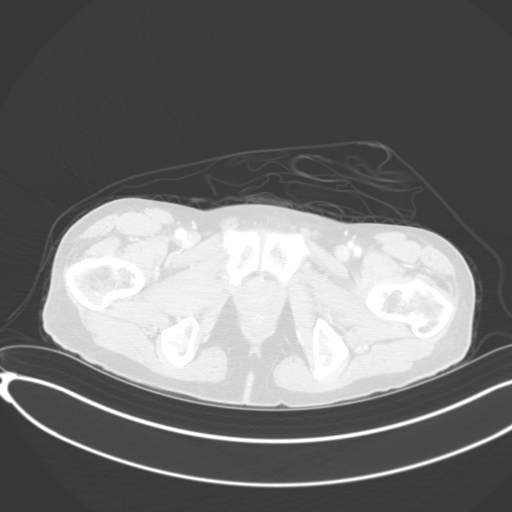
[im 41/148  lung]
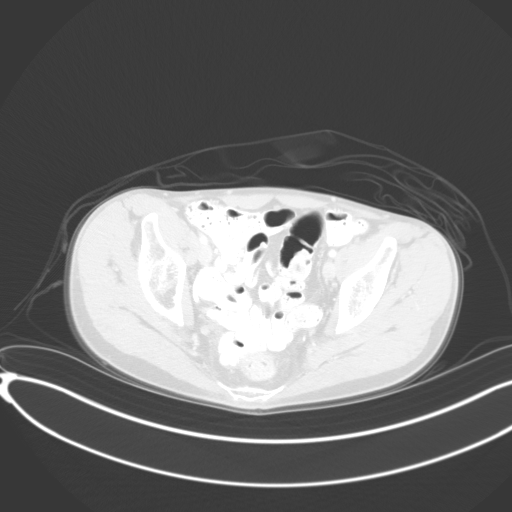
[im 54/148  lung]
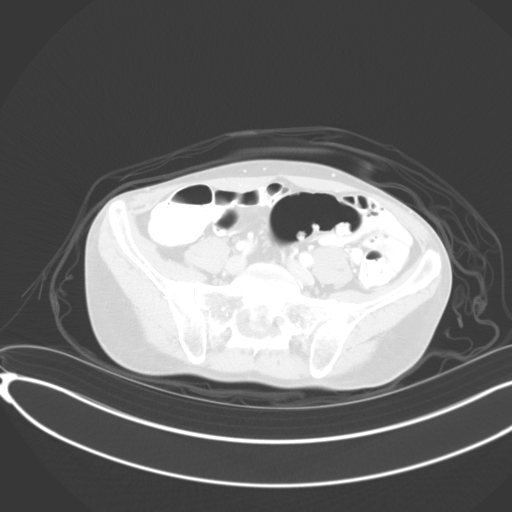
[im 81/148  mediastinal]
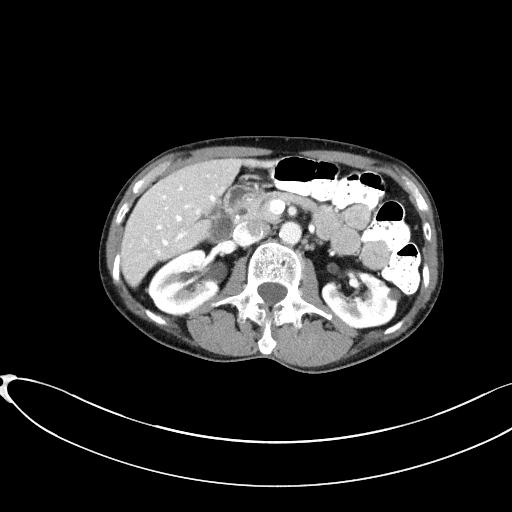
[im 81/148  lung]
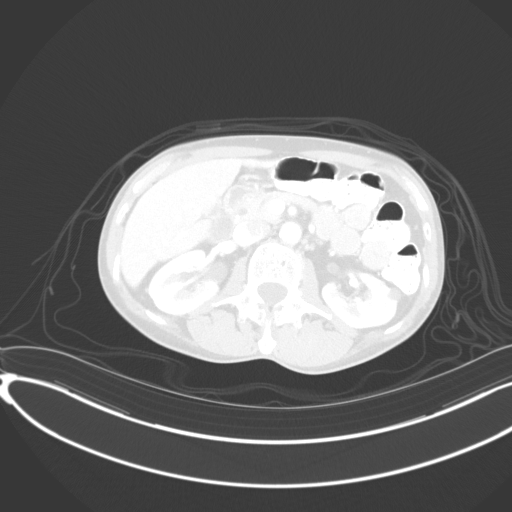
[im 94/148  lung]
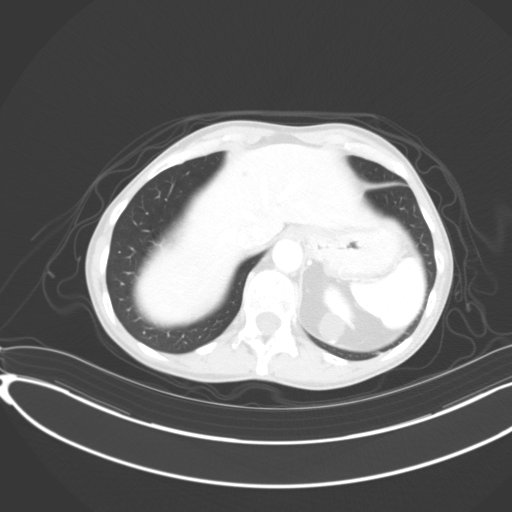
[im 107/148  lung]
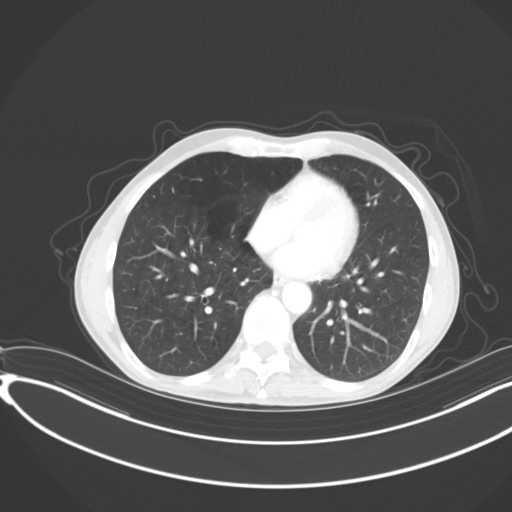
[im 121/148  lung]
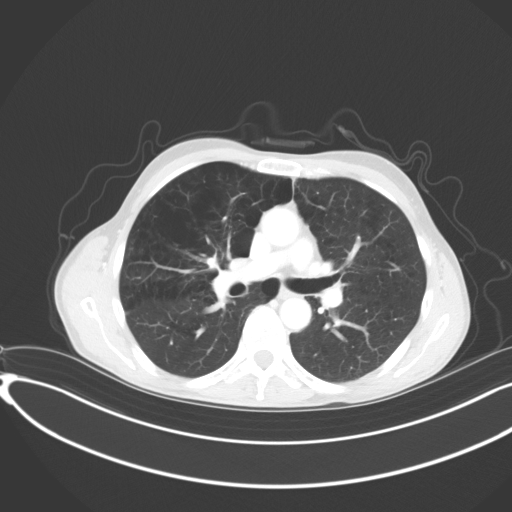
[im 134/148  mediastinal]
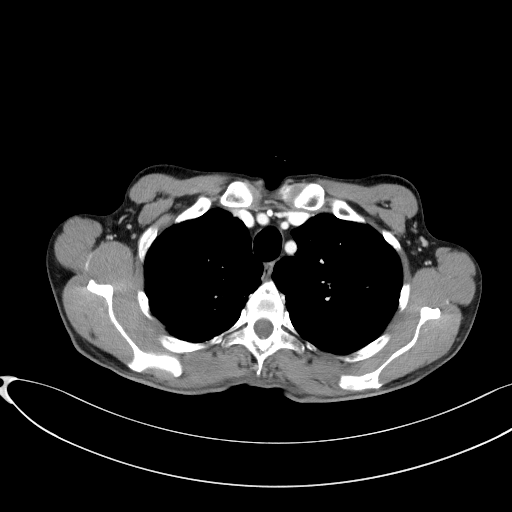
[im 134/148  lung]
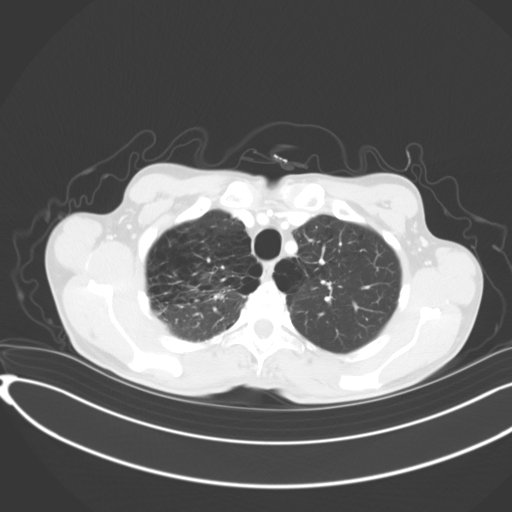

[Series 5: coronals · coronal · 0.74mm/px · 3 of 117 slices shown]
[im 24/117  lung]
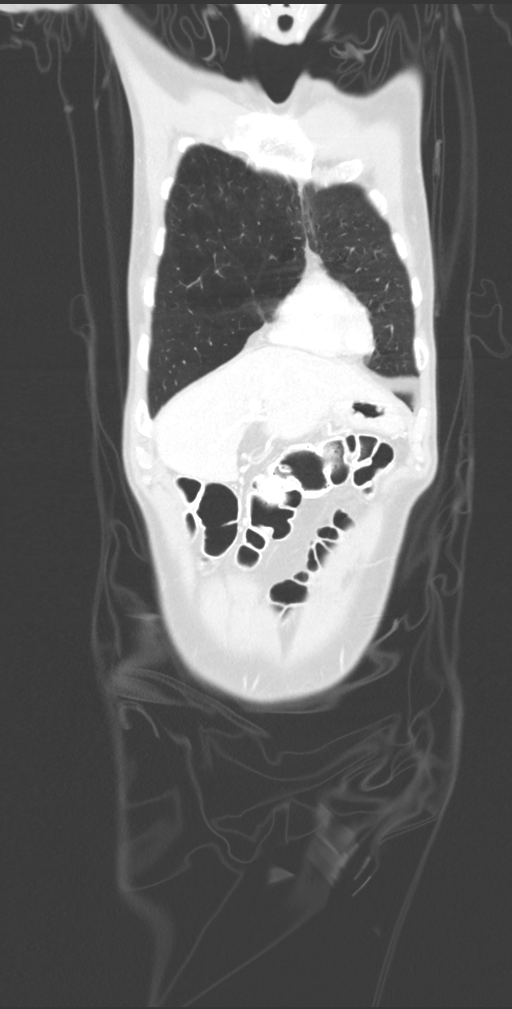
[im 47/117  lung]
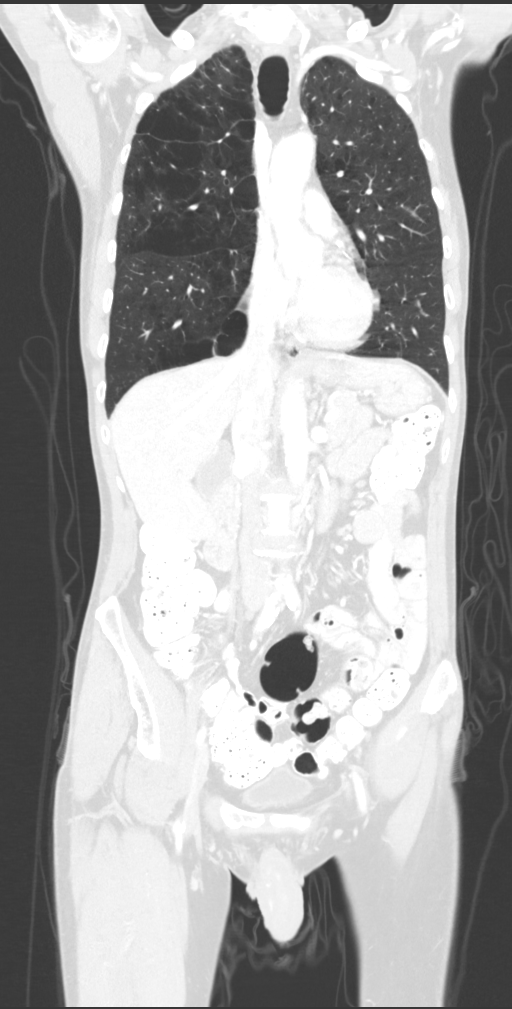
[im 70/117  lung]
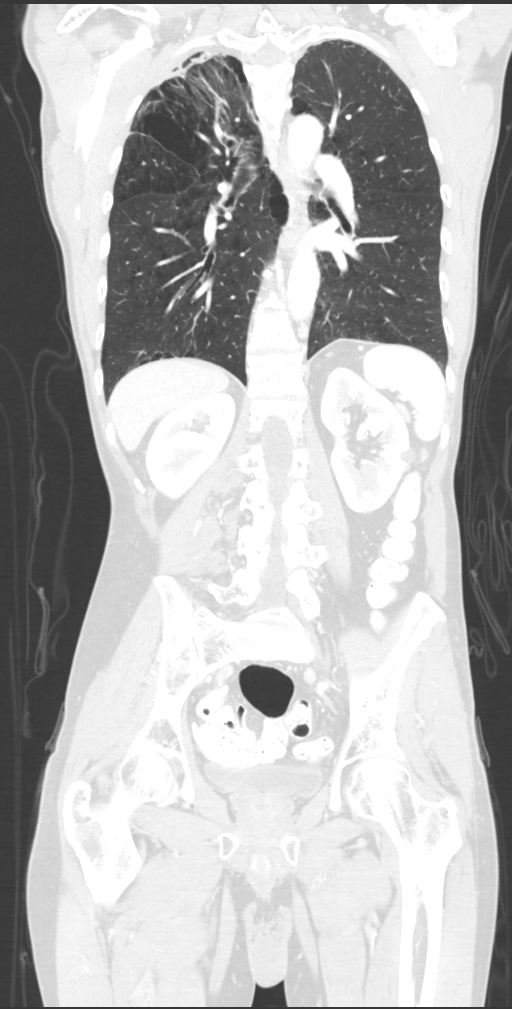

[12 of 36 positions shown; findings below may reference images not displayed]

FINDINGS: CT CHEST FINDINGS

Cardiovascular: Normal heart size. No significant pericardial
effusion/thickening. Left anterior descending and left circumflex
coronary atherosclerosis. Atherosclerotic nonaneurysmal thoracic
aorta. Normal caliber pulmonary arteries. No central pulmonary
emboli.

Mediastinum/Nodes: No discrete thyroid nodules. Unremarkable
esophagus. No pathologically enlarged axillary, mediastinal or hilar
lymph nodes.

Lungs/Pleura: No pneumothorax. No pleural effusion. Severe
centrilobular and paraseptal emphysema with mild diffuse bronchial
wall thickening. No acute consolidative airspace disease, lung
masses or significant pulmonary nodules. Curvilinear biapical mild
pleuroparenchymal scarring, right greater than left.

Musculoskeletal: No aggressive appearing focal osseous lesions. Mild
thoracic spondylosis.

CT ABDOMEN PELVIS FINDINGS

Hepatobiliary: Normal liver size. Several subcentimeter hypodense
liver lesions scattered throughout the liver, too small to
characterize, requiring no follow-up unless the patient risk factors
for liver malignancy. No overtly suspicious liver masses. Normal
gallbladder with no radiopaque cholelithiasis. No biliary ductal
dilatation.

Pancreas: Normal, with no mass or duct dilation.

Spleen: Normal size. No mass.

Adrenals/Urinary Tract: Normal adrenals. No hydronephrosis.
Exophytic simple 2.2 cm posterior upper left renal cyst. Simple
cm upper right renal cyst. Several subcentimeter hypodense renal
cortical lesions scattered in the left kidney, too small to
characterize, requiring no follow-up. Normal bladder.

Stomach/Bowel: Normal non-distended stomach. Normal caliber small
bowel with no small bowel wall thickening. Normal appendix. Oral
contrast transits to distal colon. Normal large bowel with no
diverticulosis, large bowel wall thickening or pericolonic fat
stranding.

Vascular/Lymphatic: Atherosclerotic nonaneurysmal abdominal aorta.
Patent portal, splenic, hepatic and renal veins. No pathologically
enlarged lymph nodes in the abdomen or pelvis.

Reproductive: Top-normal size prostate.

Other: No pneumoperitoneum, ascites or focal fluid collection.

Musculoskeletal: No aggressive appearing focal osseous lesions. Mild
lumbar spondylosis.
IMPRESSION: 1. No acute abnormality. No evidence of bowel obstruction or acute
bowel inflammation. Normal appendix.
2. No lymphadenopathy or other potentially neoplastic findings to
explain the patient's weight loss.
3. Severe centrilobular and paraseptal emphysema with mild diffuse
bronchial wall thickening, suggesting COPD.
4. Two-vessel coronary atherosclerosis.
5. Aortic Atherosclerosis (VM2P8-W9E.E) and Emphysema (VM2P8-IW6.6).

## 2022-11-19 ENCOUNTER — Other Ambulatory Visit: Payer: Self-pay | Admitting: Gastroenterology

## 2023-10-07 ENCOUNTER — Other Ambulatory Visit: Payer: Self-pay | Admitting: Gastroenterology
